# Patient Record
Sex: Female | Born: 1990 | Race: Black or African American | Hispanic: No | Marital: Single | Smoking: Never smoker
Health system: Southern US, Community
[De-identification: ages and names within clinical notes are randomized; demographics above are authoritative.]

## PROBLEM LIST (undated history)

## (undated) ENCOUNTER — Emergency Department (HOSPITAL_BASED_OUTPATIENT_CLINIC_OR_DEPARTMENT_OTHER): Admission: EM | Payer: Medicaid Other | Source: Home / Self Care

## (undated) ENCOUNTER — Inpatient Hospital Stay (HOSPITAL_COMMUNITY): Payer: Self-pay

## (undated) DIAGNOSIS — R7303 Prediabetes: Secondary | ICD-10-CM

## (undated) DIAGNOSIS — Z789 Other specified health status: Secondary | ICD-10-CM

## (undated) DIAGNOSIS — E559 Vitamin D deficiency, unspecified: Secondary | ICD-10-CM

## (undated) DIAGNOSIS — D649 Anemia, unspecified: Secondary | ICD-10-CM

## (undated) HISTORY — DX: Other specified health status: Z78.9

## (undated) HISTORY — PX: NO PAST SURGERIES: SHX2092

---

## 2015-05-02 ENCOUNTER — Emergency Department (HOSPITAL_COMMUNITY): Admission: EM | Admit: 2015-05-02 | Discharge: 2015-05-03 | Discharge status: Against Medical Advice | Payer: Self-pay

## 2015-05-02 NOTE — ED Notes (Signed)
Called pt 3x for triage. No response.  

## 2015-06-03 ENCOUNTER — Encounter (HOSPITAL_COMMUNITY): Payer: Self-pay

## 2015-06-03 ENCOUNTER — Emergency Department (HOSPITAL_COMMUNITY)
Admission: EM | Admit: 2015-06-03 | Discharge: 2015-06-03 | Disposition: A | Discharge status: Home / Self Care | Payer: Self-pay | Attending: Emergency Medicine | Admitting: Emergency Medicine

## 2015-06-03 DIAGNOSIS — R197 Diarrhea, unspecified: Secondary | ICD-10-CM | POA: Insufficient documentation

## 2015-06-03 DIAGNOSIS — R05 Cough: Secondary | ICD-10-CM | POA: Insufficient documentation

## 2015-06-03 DIAGNOSIS — K0889 Other specified disorders of teeth and supporting structures: Secondary | ICD-10-CM | POA: Insufficient documentation

## 2015-06-03 DIAGNOSIS — R51 Headache: Secondary | ICD-10-CM | POA: Insufficient documentation

## 2015-06-03 DIAGNOSIS — K08409 Partial loss of teeth, unspecified cause, unspecified class: Secondary | ICD-10-CM | POA: Insufficient documentation

## 2015-06-03 DIAGNOSIS — E669 Obesity, unspecified: Secondary | ICD-10-CM | POA: Insufficient documentation

## 2015-06-03 DIAGNOSIS — K029 Dental caries, unspecified: Secondary | ICD-10-CM | POA: Insufficient documentation

## 2015-06-03 MED ORDER — PENICILLIN V POTASSIUM 500 MG PO TABS: 500.0000 mg | ORAL_TABLET | Freq: Four times a day (QID) | ORAL | Status: AC

## 2015-06-03 MED ORDER — IBUPROFEN 400 MG PO TABS
600.0000 mg | ORAL_TABLET | Freq: Once | ORAL | Status: AC
  Administered 2015-06-03: 600 mg via ORAL
  Filled 2015-06-03: qty 1

## 2015-06-03 NOTE — ED Notes (Signed)
Patient here with left lower dental pain x 12 week, now having headache that she thinks is related to same, no distress

## 2015-06-03 NOTE — ED Provider Notes (Signed)
History  By signing my name below, I, Karle PlumberJennifer Tensley, attest that this documentation has been prepared under the direction and in the presence of LandAmerica FinancialBen Darrious Youman, PA-C. Electronically Signed: Karle PlumberJennifer Tensley, ED Scribe. 06/03/2015. 9:49 AM.  Chief Complaint  Patient presents with  . Dental Pain  . Headache   The history is provided by the patient and medical records. No language interpreter was used.    HPI Comments:  Samantha Patrick is a 25 y.o. obese female who presents to the Emergency Department complaining of severe left lower dental pain that has been ongoing for the past 4 months. She reports subjective fever, HA, cough and intermittent diarrhea all for the past 4 months. She reports taking Tylenol and Motrin for the pain with minimal relief of the pain. Eating increases the dental pain. She denies alleviating factors. She denies nausea, vomiting, facial swelling, difficulty swallowing, difficulty breathing. She does not have a dentist or a PCP.  History reviewed. No pertinent past medical history. History reviewed. No pertinent past surgical history. No family history on file. Social History  Substance Use Topics  . Smoking status: Never Smoker   . Smokeless tobacco: None  . Alcohol Use: None   OB History    No data available     Review of Systems A 10 point review of systems was completed and was negative except for pertinent positives and negatives as mentioned in the history of present illness   Allergies  Review of patient's allergies indicates no known allergies.  Home Medications   Prior to Admission medications   Medication Sig Start Date End Date Taking? Authorizing Provider  penicillin v potassium (VEETID) 500 MG tablet Take 1 tablet (500 mg total) by mouth 4 (four) times daily. 06/03/15 06/10/15  Joycie PeekBenjamin Kyndall Amero, PA-C   Triage Vitals: BP 118/86 mmHg  Pulse 76  Temp(Src) 97.4 F (36.3 C) (Oral)  Resp 20  Ht 5\' 4"  (1.626 m)  Wt 198 lb 12.8 oz (90.175 kg)   BMI 34.11 kg/m2  SpO2 100% Physical Exam  Constitutional: She appears well-developed and well-nourished. No distress.  HENT:  Head: Normocephalic.  Discomfort located to left mandibular premolars. Overall poor dentition. Multiple missing teeth with active caries. Mucous membranes are moist. No unilateral tonsillar swelling, uvula midline, no glossal swelling or elevation. No trismus. No fluctuance or evidence of a drainable abscess. No other evidence of emergent infection, Retropharyngeal or Peritonsillar abscess, Ludwig or Vincents angina. Tolerating secretions well. Patent airway   Eyes: Conjunctivae and EOM are normal.  Neck: Normal range of motion. Neck supple.  Pulmonary/Chest: Effort normal. No respiratory distress.  Abdominal: Soft. She exhibits no distension.  Musculoskeletal: Normal range of motion.  Neurological: She is alert.  Skin: She is not diaphoretic.  Psychiatric: She has a normal mood and affect.    ED Course  Procedures (including critical care time) DIAGNOSTIC STUDIES: Oxygen Saturation is 100% on RA, normal by my interpretation.   COORDINATION OF CARE: 9:48 AM- Offered to administer dental block but pt declined. Will prescribe Motrin, provide work note and give referral to dentist. Also informed pt of dental clinic in [place]. Pt verbalizes understanding and agrees to plan.  Medications  ibuprofen (ADVIL,MOTRIN) tablet 600 mg (600 mg Oral Given 06/03/15 1014)    Labs Review Labs Reviewed - No data to display  Imaging Review No results found. I have personally reviewed and evaluated these images and lab results as part of my medical decision-making.   EKG Interpretation None  Filed Vitals:   06/03/15 0827  BP: 118/86  Pulse: 76  Temp: 97.4 F (36.3 C)  TempSrc: Oral  Resp: 20  Height:  (1.626 m)  Weight: 90.175 kg  SpO2: 100%   Meds given in ED:  Medications  ibuprofen (ADVIL,MOTRIN) tablet 600 mg (600 mg Oral Given 06/03/15 1014)     Discharge Medication List as of 06/03/2015 10:03 AM    START taking these medications   Details  penicillin v potassium (VEETID) 500 MG tablet Take 1 tablet (500 mg total) by mouth 4 (four) times daily., Starting 06/03/2015, Until Tue 06/10/15, Print        MDM  Here for evaluation of dental pain. On exam, there is no evidence of a drainable abscess. No trismus, glossal elevation, unilateral tonsillar swelling. No evidence of retropharyngeal or peritonsillar abscess or Ludwig angina. Patient Refuses oral nerve block, requests Motrin. Discharged with outpatient dental resources. Also given prescription for anti-inflammatories and antibiotics. Overall, appears well, nontoxic and appropriate for discharge.  Final diagnoses:  Pain, dental    I personally performed the services described in this documentation, which was scribed in my presence. The recorded information has been reviewed and is accurate.     Joycie Peek, PA-C 06/03/15 1046  Alvira Monday, MD 06/04/15 1144

## 2015-06-03 NOTE — Discharge Instructions (Signed)
Please take your antibiotics as prescribed. Follow-up with your dentist or use to test resources to find a dentist for definitive care. Continue using Tylenol and Motrin for your discomfort. Return to ED for any new or worsening symptoms as discussed.  [facility]  [employer] [facility] [employer]  [facility] [facility]  [address]  [place], [place] [address]  Phone [phone]  [facility] [employer] [facility] [place] exemplifies the [employer] vision to improve the health and quality of life of all [place] by Public house managercreating leaders with a passion to care for the underserved and by leading the nation in community-based, service learning oral health education. We are committed to offering comprehensive general dental services for adults, children and special needs patients in a safe, caring and professional setting.  Appointments: Our clinic is open Monday through Friday 8:00 a.m. until 5:00 p.m. The amount of time scheduled for an appointment depends on the patients specific needs. We ask that you keep your appointed time for care or provide 24-hour notice of all appointment changes. Parents or legal guardians must accompany minor children.  Payment for Services: Medicaid and other insurance plans are welcome. Payment for services is due when services are rendered and may be made by cash or credit card. If you have dental insurance, we will assist you with your claim submission.   Emergencies: Emergency services will be provided Monday through Friday on a walk-in basis. Please arrive early for emergency services. After hours emergency services will be provided for patients of record as required.  Services:  Medical illustratorComprehensive General Dentistry  Childrens Dentistry  Oral Surgery - Extractions  Root Canals  Sealants and Tooth Colored Fillings  Crowns and Bridges   Dentures and Partial Dentures  Implant Services  Periodontal Services and Cleanings  Cosmetic Building services engineerTooth Whitening  Digital Radiography  3-D/[facility] Imaging

## 2015-10-27 ENCOUNTER — Emergency Department (HOSPITAL_COMMUNITY)
Admission: EM | Admit: 2015-10-27 | Discharge: 2015-10-27 | Disposition: A | Discharge status: Home / Self Care | Payer: Self-pay | Attending: Emergency Medicine | Admitting: Emergency Medicine

## 2015-10-27 ENCOUNTER — Emergency Department (HOSPITAL_COMMUNITY): Payer: Self-pay

## 2015-10-27 ENCOUNTER — Encounter (HOSPITAL_COMMUNITY): Payer: Self-pay

## 2015-10-27 DIAGNOSIS — R0789 Other chest pain: Secondary | ICD-10-CM | POA: Insufficient documentation

## 2015-10-27 DIAGNOSIS — G43909 Migraine, unspecified, not intractable, without status migrainosus: Secondary | ICD-10-CM | POA: Insufficient documentation

## 2015-10-27 LAB — BASIC METABOLIC PANEL
ANION GAP: 8 (ref 5–15)
BUN: 8 mg/dL (ref 6–20)
CHLORIDE: 109 mmol/L (ref 101–111)
CO2: 21 mmol/L — AB (ref 22–32)
Calcium: 9.2 mg/dL (ref 8.9–10.3)
Creatinine, Ser: 0.87 mg/dL (ref 0.44–1.00)
GFR calc Af Amer: >60 mL/min (ref >60)
GFR calc non Af Amer: >60 mL/min (ref >60)
GLUCOSE: 91 mg/dL (ref 65–99)
POTASSIUM: 3.7 mmol/L (ref 3.5–5.1)
Sodium: 138 mmol/L (ref 135–145)

## 2015-10-27 LAB — CBC
HEMATOCRIT: 39.6 % (ref 36.0–46.0)
HEMOGLOBIN: 12.9 g/dL (ref 12.0–15.0)
MCH: 27.7 pg (ref 26.0–34.0)
MCHC: 32.6 g/dL (ref 30.0–36.0)
MCV: 85 fL (ref 78.0–100.0)
Platelets: 338 10*3/uL (ref 150–400)
RBC: 4.66 MIL/uL (ref 3.87–5.11)
RDW: 13.7 % (ref 11.5–15.5)
WBC: 9.2 10*3/uL (ref 4.0–10.5)

## 2015-10-27 LAB — I-STAT TROPONIN, ED
Troponin i, poc: 0 ng/mL (ref 0.00–0.08)
Troponin i, poc: 0 ng/mL (ref 0.00–0.08)

## 2015-10-27 LAB — I-STAT BETA HCG BLOOD, ED (MC, WL, AP ONLY)

## 2015-10-27 MED ORDER — IBUPROFEN 600 MG PO TABS: 600.0000 mg | ORAL_TABLET | Freq: Four times a day (QID) | ORAL | 0 refills | Status: AC | PRN

## 2015-10-27 MED ORDER — IBUPROFEN 400 MG PO TABS
600.0000 mg | ORAL_TABLET | Freq: Once | ORAL | Status: AC
  Administered 2015-10-27: 600 mg via ORAL
  Filled 2015-10-27: qty 1

## 2015-10-27 NOTE — ED Provider Notes (Signed)
[facility]-EMERGENCY DEPT Provider Note   CSN: [id number] Arrival date & time: 10/27/15  1612  First Provider Contact:  None       History   Chief Complaint Chief Complaint  Patient presents with  . Chest Pain  . Migraine    HPI Samantha Patrick is a 25 y.o. female.  HPI  Patient with no PMH presents with concern for chest pain.  It started this morning, is tight in nature, radiates to the left arm, and was 8/10 at max.  It is 6/10 right now.  It is worse with moving the upper extremities, but not with exertion.  There is no associated SOB or abdominal pain.    No past medical history on file.  There are no active problems to display for this patient.   No past surgical history on file.  OB History    No data available       Home Medications    Prior to Admission medications   Not on File    Family History No family history on file.  Social History Social History  Substance Use Topics  . Smoking status: Never Smoker  . Smokeless tobacco: Never Used  . Alcohol use Yes     Allergies   Review of patient's allergies indicates no known allergies.   Review of Systems Review of Systems  Constitutional: Negative for chills and fever.  HENT: Negative for ear pain and sore throat.   Eyes: Negative for pain and visual disturbance.  Respiratory: Negative for cough and shortness of breath.   Cardiovascular: Positive for chest pain. Negative for palpitations.  Gastrointestinal: Negative for abdominal pain and vomiting.  Genitourinary: Negative for dysuria and hematuria.  Musculoskeletal: Negative for arthralgias and back pain.  Skin: Negative for color change and rash.  Neurological: Negative for seizures and syncope.  All other systems reviewed and are negative.    Physical Exam Updated Vital Signs BP 133/82   Pulse 84   Temp 98.2 F (36.8 C) (Oral)   Resp 16   Ht 5\' 3"  (1.6 m)   LMP 10/09/2015 (Exact Date)   SpO2 97%   Physical Exam  Constitutional:  She appears well-developed and well-nourished. No distress.  HENT:  Head: Normocephalic and atraumatic.  Eyes: Conjunctivae are normal.  Neck: Neck supple.  Cardiovascular: Normal rate and regular rhythm.   No murmur heard. Pulmonary/Chest: Effort normal and breath sounds normal. No respiratory distress. She exhibits tenderness.  Abdominal: Soft. There is no tenderness.  Musculoskeletal: She exhibits no edema.  Neurological: She is alert.  Skin: Skin is warm and dry.  Psychiatric: She has a normal mood and affect.  Nursing note and vitals reviewed.    ED Treatments / Results  Labs (all labs ordered are listed, but only abnormal results are displayed) Labs Reviewed  BASIC METABOLIC PANEL - Abnormal; Notable for the following:       Result Value   CO2 21 (*)    All other components within normal limits  CBC  I-STAT TROPOININ, ED    EKG  EKG Interpretation None       Radiology Dg Chest 2 View  Result Date: 10/27/2015 CLINICAL DATA:  Chest pain. EXAM: CHEST  2 VIEW COMPARISON:  None. FINDINGS: The heart size and mediastinal contours are within normal limits. Both lungs are clear. No pneumothorax or pleural effusion is noted. The visualized skeletal structures are unremarkable. IMPRESSION: No active cardiopulmonary disease. Electronically Signed   By: Lupita RaiderJames  Green Jr, M.D.  On: 10/27/2015 17:02    Procedures Procedures (including critical care time)  Medications Ordered in ED Medications - No data to display   Initial Impression / Assessment and Plan / ED Course  I have reviewed the triage vital signs and the nursing notes.  Pertinent labs & imaging results that were available during my care of the patient were reviewed by me and considered in my medical decision making (see chart for details).  Clinical Course     Patient presents for evaluation of chest pain.  HEART Pathway evaluation is low risk with a score of 2.  Troponin negative x2.  EKG is NSR with  non-specific T-wave changes.  Feel like PNA unlikely given CXR without infiltrates or effusion, no leukocytosis, no fever.  No mediastinal widening on CXR.  Doubt emergent intraabdominal pathology given benign abdominal exam and no abdominal symptoms.  Low risk Wells and PERC.  Impression is mmsk chest pain.    Final Clinical Impressions(s) / ED Diagnoses   Final diagnoses:  None    New Prescriptions New Prescriptions   No medications on file     Marcelina Morel, MD 10/27/15 2229    Laurence Spates, MD 10/31/15 1724

## 2015-10-27 NOTE — ED Triage Notes (Signed)
Pt complaining of chest pain and migraine. Pt complaining of L chest tightness beginning today.

## 2015-12-20 ENCOUNTER — Emergency Department (HOSPITAL_COMMUNITY): Payer: Self-pay

## 2015-12-20 ENCOUNTER — Encounter (HOSPITAL_COMMUNITY): Payer: Self-pay | Admitting: Oncology

## 2015-12-20 ENCOUNTER — Emergency Department (HOSPITAL_COMMUNITY)
Admission: EM | Admit: 2015-12-20 | Discharge: 2015-12-20 | Disposition: A | Discharge status: Home / Self Care | Payer: Self-pay | Attending: Emergency Medicine | Admitting: Emergency Medicine

## 2015-12-20 DIAGNOSIS — S060X0A Concussion without loss of consciousness, initial encounter: Secondary | ICD-10-CM | POA: Insufficient documentation

## 2015-12-20 DIAGNOSIS — Y939 Activity, unspecified: Secondary | ICD-10-CM | POA: Insufficient documentation

## 2015-12-20 DIAGNOSIS — Y929 Unspecified place or not applicable: Secondary | ICD-10-CM | POA: Insufficient documentation

## 2015-12-20 DIAGNOSIS — Y999 Unspecified external cause status: Secondary | ICD-10-CM | POA: Insufficient documentation

## 2015-12-20 DIAGNOSIS — W228XXA Striking against or struck by other objects, initial encounter: Secondary | ICD-10-CM | POA: Insufficient documentation

## 2015-12-20 MED ORDER — PROMETHAZINE HCL 25 MG/ML IJ SOLN
25.0000 mg | Freq: Once | INTRAMUSCULAR | Status: DC
  Filled 2015-12-20: qty 1

## 2015-12-20 MED ORDER — KETOROLAC TROMETHAMINE 60 MG/2ML IM SOLN
30.0000 mg | Freq: Once | INTRAMUSCULAR | Status: DC
  Filled 2015-12-20: qty 2

## 2015-12-20 NOTE — ED Triage Notes (Signed)
Pt hit her head at work last week denies LOC.  Pt evaluated w/ no acute findings.  Pt states she has had a persistent HA on right side since time of accident.

## 2015-12-20 NOTE — ED Provider Notes (Signed)
[facility]-EMERGENCY DEPT Provider Note   CSN: [id number] Arrival date & time: 12/20/15  0040   By signing my name below, I, Samantha Patrick, attest that this documentation has been prepared under the direction and in the presence of Benjiman CoreNathan Marvell Tamer, MD.  Electronically Signed: Suzan SlickAshley N. Elon Patrick, ED Scribe. 12/20/15. 4:30 AM.   History   Chief Complaint Chief Complaint  Patient presents with  . Headache   The history is provided by the patient. No language interpreter was used.   HPI Comments: Samantha Patrick is a 25 y.o. female without any pertinent past medical history who presents to the Emergency Department complaining of a waxing and waning, unchanged R side HA s/p head injury sustained 1 week ago. Pt described discomfort as burning. The pt reports associated onset visual disturbance at time of head injury.  However, currently she states visual field is back to baseline. She denies syncope, numbness, or weakness.   PCP: No PCP Per Patient     History reviewed. No pertinent past medical history.  There are no active problems to display for this patient.   History reviewed. No pertinent surgical history.  OB History    No data available       Home Medications    Prior to Admission medications   Medication Sig Start Date End Date Taking? Authorizing Provider  diphenhydrAMINE (BENADRYL) 25 MG tablet Take 25 mg by mouth daily as needed for allergies.   Yes Historical Provider, MD  ibuprofen (ADVIL,MOTRIN) 200 MG tablet Take 200 mg by mouth every 6 (six) hours as needed for moderate pain.   Yes Historical Provider, MD    Family History No family history on file.  Social History Social History  Substance Use Topics  . Smoking status: Never Smoker  . Smokeless tobacco: Never Used  . Alcohol use Yes     Allergies   Review of patient's allergies indicates no known allergies.   Review of Systems Review of Systems  Constitutional: Negative for chills and fever.    Gastrointestinal: Negative for nausea and vomiting.  Neurological: Positive for headaches. Negative for syncope, weakness and numbness.  All other systems reviewed and are negative.    Physical Exam Updated Vital Signs BP 121/90 (BP Location: Left Arm)   Pulse 75   Temp 98 F (36.7 C) (Oral)   Resp 16   Ht 5\' 3"  (1.6 m)   Wt 208 lb (94.3 kg)   LMP 10/28/2015 (Approximate)   SpO2 100%   BMI 36.85 kg/m   Physical Exam  Constitutional: She is oriented to person, place, and time. She appears well-developed and well-nourished. No distress.  HENT:  Head: Normocephalic and atraumatic.  Tenderness to right temporal area of skull.  Eyes: EOM are normal. Pupils are equal, round, and reactive to light.  Neck: Normal range of motion.  Cardiovascular: Normal rate, regular rhythm and normal heart sounds.   Pulmonary/Chest: Effort normal and breath sounds normal.  Abdominal: Soft. She exhibits no distension. There is no tenderness.  Musculoskeletal: Normal range of motion.  Neurological: She is alert and oriented to person, place, and time.  Skin: Skin is warm and dry. No erythema.  Psychiatric: She has a normal mood and affect. Judgment normal.  Nursing note and vitals reviewed.    ED Treatments / Results   DIAGNOSTIC STUDIES: Oxygen Saturation is 100% on RA, normal by my interpretation.    COORDINATION OF CARE:  3:50 AM- Will order imaging, blood work and IV medications. Discussed treatment  plan with pt at bedside and pt agreed to plan.    Labs (all labs ordered are listed, but only abnormal results are displayed) Labs Reviewed - No data to display  EKG  EKG Interpretation None       Radiology Ct Head Wo Contrast  Result Date: 12/20/2015 CLINICAL DATA:  Headache after hitting head 1 week ago. No loss of consciousness. EXAM: CT HEAD WITHOUT CONTRAST TECHNIQUE: Contiguous axial images were obtained from the base of the skull through the vertex without intravenous  contrast. COMPARISON:  None. FINDINGS: Brain: No evidence of acute infarction, hemorrhage, hydrocephalus, extra-axial collection or mass lesion/mass effect. Vascular: No hyperdense vessel or unexpected calcification. Skull: Normal. Negative for fracture or focal lesion. Sinuses/Orbits: Retention cysts in the right maxillary antrum. Paranasal sinuses and mastoid air cells are otherwise clear. Other: None. IMPRESSION: No acute intracranial abnormalities. Electronically Signed   By: Burman Nieves M.D.   On: 12/20/2015 04:30    Procedures Procedures (including critical care time)  Medications Ordered in ED Medications  promethazine (PHENERGAN) injection 25 mg (25 mg Intramuscular Not Given 12/20/15 0359)  ketorolac (TORADOL) injection 30 mg (30 mg Intramuscular Refused 12/20/15 0358)     Initial Impression / Assessment and Plan / ED Course  I have reviewed the triage vital signs and the nursing notes.  Pertinent labs & imaging results that were available during my care of the patient were reviewed by me and considered in my medical decision making (see chart for details).  Clinical Course    Patient with headache post getting hit in the head. Some tenderness over temporal area. Head CT reassuring. Patient refused Phenergan and Toradol for the headache. Likely concussion. Will discharge home.  Final Clinical Impressions(s) / ED Diagnoses   Final diagnoses:  Concussion, without loss of consciousness, initial encounter    New Prescriptions New Prescriptions   No medications on file  I personally performed the services described in this documentation, which was scribed in my presence. The recorded information has been reviewed and is accurate.      Benjiman Core, MD 12/20/15 [id number]

## 2015-12-20 NOTE — ED Notes (Signed)
C/o headache to right side of head X1 week. Patient states she hit her head last week at work. Denies LOC. But c/o headache and photophobia

## 2015-12-20 NOTE — ED Notes (Signed)
Patient resting in bed, family remains at bedside. No needs voiced at this time.

## 2015-12-20 NOTE — ED Notes (Signed)
Patient verbalizes understanding of discharge instructions, home care and follow up care. Patient ambulatory out of department at this time with family. 

## 2016-01-09 ENCOUNTER — Encounter (HOSPITAL_COMMUNITY): Payer: Self-pay

## 2016-01-09 ENCOUNTER — Emergency Department (HOSPITAL_COMMUNITY)
Admission: EM | Admit: 2016-01-09 | Discharge: 2016-01-09 | Disposition: A | Discharge status: Home / Self Care | Payer: Self-pay | Attending: Emergency Medicine | Admitting: Emergency Medicine

## 2016-01-09 DIAGNOSIS — Z79899 Other long term (current) drug therapy: Secondary | ICD-10-CM | POA: Insufficient documentation

## 2016-01-09 DIAGNOSIS — R112 Nausea with vomiting, unspecified: Secondary | ICD-10-CM | POA: Insufficient documentation

## 2016-01-09 LAB — CBC
HEMATOCRIT: 35.8 % — AB (ref 36.0–46.0)
HEMOGLOBIN: 11.9 g/dL — AB (ref 12.0–15.0)
MCH: 27.5 pg (ref 26.0–34.0)
MCHC: 33.2 g/dL (ref 30.0–36.0)
MCV: 82.7 fL (ref 78.0–100.0)
Platelets: 301 10*3/uL (ref 150–400)
RBC: 4.33 MIL/uL (ref 3.87–5.11)
RDW: 13.7 % (ref 11.5–15.5)
WBC: 7.9 10*3/uL (ref 4.0–10.5)

## 2016-01-09 LAB — COMPREHENSIVE METABOLIC PANEL
ALBUMIN: 3.8 g/dL (ref 3.5–5.0)
ALK PHOS: 47 U/L (ref 38–126)
ALT: 14 U/L (ref 14–54)
ANION GAP: 6 (ref 5–15)
AST: 20 U/L (ref 15–41)
BUN: 12 mg/dL (ref 6–20)
CHLORIDE: 106 mmol/L (ref 101–111)
CO2: 25 mmol/L (ref 22–32)
Calcium: 9 mg/dL (ref 8.9–10.3)
Creatinine, Ser: 0.83 mg/dL (ref 0.44–1.00)
GFR calc Af Amer: >60 mL/min (ref >60)
GFR calc non Af Amer: >60 mL/min (ref >60)
GLUCOSE: 78 mg/dL (ref 65–99)
POTASSIUM: 3.5 mmol/L (ref 3.5–5.1)
SODIUM: 137 mmol/L (ref 135–145)
Total Bilirubin: 0.7 mg/dL (ref 0.3–1.2)
Total Protein: 7.5 g/dL (ref 6.5–8.1)

## 2016-01-09 LAB — I-STAT BETA HCG BLOOD, ED (MC, WL, AP ONLY): I-stat hCG, quantitative: <5 m[IU]/mL (ref <5)

## 2016-01-09 LAB — LIPASE, BLOOD: Lipase: 31 U/L (ref 11–51)

## 2016-01-09 MED ORDER — ONDANSETRON 4 MG PO TBDP
4.0000 mg | ORAL_TABLET | Freq: Once | ORAL | Status: AC | PRN
  Administered 2016-01-09: 4 mg via ORAL
  Filled 2016-01-09: qty 1

## 2016-01-09 MED ORDER — GI COCKTAIL ~~LOC~~
30.0000 mL | Freq: Once | ORAL | Status: DC
Start: 2016-01-09 — End: 2016-01-09
  Filled 2016-01-09: qty 30

## 2016-01-09 NOTE — ED Notes (Signed)
Discharge instructions and follow up care reviewed with patient. Patient verbalized understanding. 

## 2016-01-09 NOTE — ED Provider Notes (Signed)
[facility]-EMERGENCY DEPT Provider Note   CSN: [id number] Arrival date & time: 01/09/16  1417     History   Chief Complaint Chief Complaint  Patient presents with  . Abdominal Pain    HPI Samantha Patrick is a 25 y.o. female.  The history is provided by the patient.  Emesis   This is a new problem. The current episode started 3 to 5 hours ago (1230 pm). The problem occurs 2 to 4 times per day. The problem has been resolved. The emesis has an appearance of stomach contents. There has been no fever. Associated symptoms include abdominal pain (upper after vomiting). Risk factors include suspect food intake.    History reviewed. No pertinent past medical history.  There are no active problems to display for this patient.   History reviewed. No pertinent surgical history.  OB History    No data available       Home Medications    Prior to Admission medications   Medication Sig Start Date End Date Taking? Authorizing Provider  diphenhydrAMINE (BENADRYL) 25 MG tablet Take 25 mg by mouth daily as needed for allergies.    Historical Provider, MD  ibuprofen (ADVIL,MOTRIN) 200 MG tablet Take 200 mg by mouth every 6 (six) hours as needed for moderate pain.    Historical Provider, MD    Family History No family history on file.  Social History Social History  Substance Use Topics  . Smoking status: Never Smoker  . Smokeless tobacco: Never Used  . Alcohol use Yes     Allergies   Review of patient's allergies indicates no known allergies.   Review of Systems Review of Systems  Gastrointestinal: Positive for abdominal pain (upper after vomiting) and vomiting.  All other systems reviewed and are negative.    Physical Exam Updated Vital Signs BP 121/73 (BP Location: Left Arm)   Pulse 86   Temp 98.5 F (36.9 C) (Oral)   Resp 18   Ht 5\' 4"  (1.626 m)   Wt 208 lb (94.3 kg)   LMP 12/21/2015   SpO2 100%   BMI 35.70 kg/m   Physical Exam  Constitutional: She is  oriented to person, place, and time. She appears well-developed and well-nourished. No distress.  HENT:  Head: Normocephalic.  Nose: Nose normal.  Eyes: Conjunctivae are normal.  Neck: Neck supple. No tracheal deviation present.  Cardiovascular: Normal rate, regular rhythm and normal heart sounds.   Pulmonary/Chest: Effort normal and breath sounds normal. No respiratory distress.  Abdominal: Soft. She exhibits no distension. There is no tenderness. There is no rebound and no guarding.  Neurological: She is alert and oriented to person, place, and time.  Skin: Skin is warm and dry.  Psychiatric: She has a normal mood and affect.  Vitals reviewed.    ED Treatments / Results  Labs (all labs ordered are listed, but only abnormal results are displayed) Labs Reviewed  CBC - Abnormal; Notable for the following:       Result Value   Hemoglobin 11.9 (*)    HCT 35.8 (*)    All other components within normal limits  LIPASE, BLOOD  COMPREHENSIVE METABOLIC PANEL  URINALYSIS, ROUTINE W REFLEX MICROSCOPIC (NOT AT [facility])  I-STAT BETA HCG BLOOD, ED ([facility], [facility], [facility] ONLY)    EKG  EKG Interpretation None       Radiology No results found.  Procedures Procedures (including critical care time)  Medications Ordered in ED Medications  ondansetron (ZOFRAN-ODT) disintegrating tablet 4 mg (4 mg  Oral Given 01/09/16 1443)     Initial Impression / Assessment and Plan / ED Course  I have reviewed the triage vital signs and the nursing notes.  Pertinent labs & imaging results that were available during my care of the patient were reviewed by me and considered in my medical decision making (see chart for details).  Clinical Course    25 y.o. female presents with emesis after biting into a [employer] burger and seeing what appeared to her to be a small creature which caused her to vomit 4 times and she noted her nose bleed some. She states the creature crawled across her dashboard and she  caught it in a bag. She shows the bag here which has only a half eaten burger and no insects or other abnormalities. She took pictures of what appears to be a piece of lint on the burger but she states was moving. I explained that I am unaware of any such creature which may be potentially harmful to her that looks like her picture. Her symptoms have resolved. I recommended avoiding fast food and return precautions were discussed.   Final Clinical Impressions(s) / ED Diagnoses   Final diagnoses:  Nausea and vomiting, intractability of vomiting not specified, unspecified vomiting type    New Prescriptions New Prescriptions   No medications on file     Lyndal Pulleyaniel Lindyn Vossler, MD 01/10/16 0214

## 2016-01-09 NOTE — ED Triage Notes (Signed)
Pt c/o abd pain and vomiting that started after she ate a burger at [employer] today. Pt states when she threw up she noticed a bug in the vomit. Pt does not currently have diarrhea but states she feels like she needs to go. Pt reports vomiting about five times since eating burger.

## 2016-01-09 NOTE — ED Notes (Signed)
Patient made aware urine sample is needed. Patient states she cannot void at this time. Patient encouraged to void when able. 

## 2016-01-09 NOTE — ED Notes (Signed)
MD at bedside. 

## 2016-01-09 NOTE — Progress Notes (Signed)
EDCM spoke to patient at bedside. Patient confirms she does not have a pcp or insurance living in [place].  EDCM provided patient with contact information to [facility], informed patient of services there and walk in times.  EDCM also provided patient with list of pcps who accept self pay patients, list of discount pharmacies and websites needymeds.org and GoodRX.com for medication assistance, phone number to inquire about the orange card, phone number to inquire about Medicaid, phone number to inquire about the Affordable Care Act, financial resources in the community such as local churches, [employer], urban ministries, and dental assistance for uninsured patients.  Patient thankful for resources.  No further EDCM needs at this time. 

## 2016-02-05 ENCOUNTER — Encounter (HOSPITAL_COMMUNITY): Payer: Self-pay | Admitting: Emergency Medicine

## 2016-02-05 DIAGNOSIS — O3481 Maternal care for other abnormalities of pelvic organs, first trimester: Secondary | ICD-10-CM | POA: Insufficient documentation

## 2016-02-05 DIAGNOSIS — Z3A01 Less than 8 weeks gestation of pregnancy: Secondary | ICD-10-CM | POA: Insufficient documentation

## 2016-02-05 DIAGNOSIS — O23511 Infections of cervix in pregnancy, first trimester: Secondary | ICD-10-CM | POA: Insufficient documentation

## 2016-02-05 DIAGNOSIS — N83201 Unspecified ovarian cyst, right side: Secondary | ICD-10-CM | POA: Insufficient documentation

## 2016-02-05 LAB — I-STAT CHEM 8, ED
BUN: 10 mg/dL (ref 6–20)
CREATININE: 0.8 mg/dL (ref 0.44–1.00)
Calcium, Ion: 1.19 mmol/L (ref 1.15–1.40)
Chloride: 106 mmol/L (ref 101–111)
GLUCOSE: 88 mg/dL (ref 65–99)
HEMATOCRIT: 36 % (ref 36.0–46.0)
Hemoglobin: 12.2 g/dL (ref 12.0–15.0)
Potassium: 3.8 mmol/L (ref 3.5–5.1)
Sodium: 140 mmol/L (ref 135–145)
TCO2: 21 mmol/L (ref 0–100)

## 2016-02-05 LAB — POC URINE PREG, ED: Preg Test, Ur: POSITIVE — AB

## 2016-02-05 NOTE — ED Triage Notes (Signed)
Patient states she is pregnant, LMP 12/21/15.  She states that she has had cramping on and off, her hands and feet are swollen.  She is having some brown discharge, but no frank bleeding.  Patient does have some nausea, no vomiting.

## 2016-02-06 ENCOUNTER — Emergency Department (HOSPITAL_COMMUNITY): Payer: Self-pay

## 2016-02-06 ENCOUNTER — Emergency Department (HOSPITAL_COMMUNITY)
Admission: EM | Admit: 2016-02-06 | Discharge: 2016-02-06 | Disposition: A | Discharge status: Home / Self Care | Payer: Self-pay | Attending: Emergency Medicine | Admitting: Emergency Medicine

## 2016-02-06 DIAGNOSIS — N72 Inflammatory disease of cervix uteri: Secondary | ICD-10-CM

## 2016-02-06 DIAGNOSIS — Z3491 Encounter for supervision of normal pregnancy, unspecified, first trimester: Secondary | ICD-10-CM

## 2016-02-06 DIAGNOSIS — R109 Unspecified abdominal pain: Secondary | ICD-10-CM

## 2016-02-06 LAB — URINALYSIS, ROUTINE W REFLEX MICROSCOPIC
Bilirubin Urine: NEGATIVE
Glucose, UA: NEGATIVE mg/dL
HGB URINE DIPSTICK: NEGATIVE
Ketones, ur: NEGATIVE mg/dL
LEUKOCYTES UA: NEGATIVE
NITRITE: NEGATIVE
Protein, ur: NEGATIVE mg/dL
SPECIFIC GRAVITY, URINE: 1.031 — AB (ref 1.005–1.030)
pH: 7 (ref 5.0–8.0)

## 2016-02-06 LAB — COMPREHENSIVE METABOLIC PANEL
ALBUMIN: 3.4 g/dL — AB (ref 3.5–5.0)
ALT: 10 U/L — ABNORMAL LOW (ref 14–54)
AST: 18 U/L (ref 15–41)
Alkaline Phosphatase: 47 U/L (ref 38–126)
Anion gap: 7 (ref 5–15)
BUN: 11 mg/dL (ref 6–20)
CHLORIDE: 108 mmol/L (ref 101–111)
CO2: 21 mmol/L — ABNORMAL LOW (ref 22–32)
Calcium: 8.9 mg/dL (ref 8.9–10.3)
Creatinine, Ser: 0.8 mg/dL (ref 0.44–1.00)
GFR calc Af Amer: >60 mL/min (ref >60)
Glucose, Bld: 86 mg/dL (ref 65–99)
POTASSIUM: 3.8 mmol/L (ref 3.5–5.1)
Sodium: 136 mmol/L (ref 135–145)
Total Bilirubin: 0.3 mg/dL (ref 0.3–1.2)
Total Protein: 6.7 g/dL (ref 6.5–8.1)

## 2016-02-06 LAB — CBC WITH DIFFERENTIAL/PLATELET
BASOS ABS: 0 10*3/uL (ref 0.0–0.1)
BASOS PCT: 0 %
EOS PCT: 2 %
Eosinophils Absolute: 0.2 10*3/uL (ref 0.0–0.7)
HCT: 35.6 % — ABNORMAL LOW (ref 36.0–46.0)
Hemoglobin: 11.8 g/dL — ABNORMAL LOW (ref 12.0–15.0)
Lymphocytes Relative: 33 %
Lymphs Abs: 2.7 10*3/uL (ref 0.7–4.0)
MCH: 27.7 pg (ref 26.0–34.0)
MCHC: 33.1 g/dL (ref 30.0–36.0)
MCV: 83.6 fL (ref 78.0–100.0)
MONO ABS: 0.7 10*3/uL (ref 0.1–1.0)
Monocytes Relative: 9 %
Neutro Abs: 4.6 10*3/uL (ref 1.7–7.7)
Neutrophils Relative %: 56 %
PLATELETS: 305 10*3/uL (ref 150–400)
RBC: 4.26 MIL/uL (ref 3.87–5.11)
RDW: 14.4 % (ref 11.5–15.5)
WBC: 8.2 10*3/uL (ref 4.0–10.5)

## 2016-02-06 LAB — WET PREP, GENITAL
CLUE CELLS WET PREP: NONE SEEN
SPERM: NONE SEEN
TRICH WET PREP: NONE SEEN
YEAST WET PREP: NONE SEEN

## 2016-02-06 LAB — GC/CHLAMYDIA PROBE AMP (~~LOC~~) NOT AT ARMC
Chlamydia: NEGATIVE
Neisseria Gonorrhea: NEGATIVE

## 2016-02-06 LAB — HCG, QUANTITATIVE, PREGNANCY: HCG, BETA CHAIN, QUANT, S: 28701 m[IU]/mL — AB (ref <5)

## 2016-02-06 LAB — ABO/RH: ABO/RH(D): O POS

## 2016-02-06 MED ORDER — PRENATAL COMPLETE 14-0.4 MG PO TABS: 1.0000 | ORAL_TABLET | Freq: Every day | ORAL | 3 refills | Status: DC

## 2016-02-06 MED ORDER — AZITHROMYCIN 250 MG PO TABS
1000.0000 mg | ORAL_TABLET | Freq: Once | ORAL | Status: AC
  Administered 2016-02-06: 1000 mg via ORAL
  Filled 2016-02-06: qty 4

## 2016-02-06 MED ORDER — ACETAMINOPHEN 500 MG PO TABS
1000.0000 mg | ORAL_TABLET | Freq: Once | ORAL | Status: AC
  Administered 2016-02-06: 1000 mg via ORAL
  Filled 2016-02-06: qty 2

## 2016-02-06 MED ORDER — CEFTRIAXONE SODIUM 250 MG IJ SOLR
250.0000 mg | Freq: Once | INTRAMUSCULAR | Status: AC
  Administered 2016-02-06: 250 mg via INTRAMUSCULAR
  Filled 2016-02-06: qty 250

## 2016-02-06 MED ORDER — LIDOCAINE HCL (PF) 1 % IJ SOLN
INTRAMUSCULAR | Status: AC
  Filled 2016-02-06: qty 5

## 2016-02-06 NOTE — ED Notes (Signed)
Patient left at this time with all belongings. 

## 2016-02-06 NOTE — Discharge Instructions (Signed)
°  [facility] °[email or web address] °[address] °[place], [place] °[phone]  ° ° °[facility] °[email or web address] °[address] °[place], [place] °[phone]  ° ° °[facility] °[address] °[place], [place] °[phone]  ° °[facility] °[email or web address] °[address] °[place], [place] °[phone]  ° °[facility] °[email or web address] °[address] °[place], [place] °[phone]  ° °[facility] °[email or web address] °[address] °,  °[phone] ° °

## 2016-02-06 NOTE — ED Notes (Signed)
Called lab to add on CMP.

## 2016-02-06 NOTE — ED Provider Notes (Signed)
By signing my name below, I, Samantha Patrick, attest that this documentation has been prepared under the direction and in the presence of Kiley Torrence N Maylee Bare, DO . Electronically Signed: Nelwyn SalisburyJoshua Patrick, Scribe. 02/06/2016. 3:26 AM.  TIME SEEN: 3:26AM  CHIEF COMPLAINT: Abdominal Cramping  HPI:  Samantha Patrick is a 25 y.o. female who presents to the Emergency Department complaining of sudden-onset unchanged abdominal cramps beginning today. Pt states that she discovered yesterday that she was pregnant and was concerned with her cramping. She reports associated vaginal discharge, occurring one time about 8 days ago.  She describes as is a brown discharge. No vaginal bleeding. Has had nausea but no vomiting. Had diarrhea several days ago that has resolved. She denies any dysuria or hematuria. G/P/A - 1/0/0. No history of STD. Does not have an OB/GYN. Has not yet had an ultrasound of this pregnancy.  ROS: See HPI Constitutional: no fever  Eyes: no drainage  ENT: no runny nose   Cardiovascular:  no chest pain  Resp: no SOB  GI: vomiting, nausea, diarrhea. GU: no dysuria, no hematuria, vaginal discharge (1x) Integumentary: no rash  Allergy: no hives  Musculoskeletal: no leg swelling  Neurological: no slurred speech ROS otherwise negative  PAST MEDICAL HISTORY/PAST SURGICAL HISTORY:  History reviewed. No pertinent past medical history.  MEDICATIONS:  Prior to Admission medications   Medication Sig Start Date End Date Taking? Authorizing Provider  diphenhydrAMINE (BENADRYL) 25 MG tablet Take 25 mg by mouth daily as needed for allergies.    Historical Provider, MD  ibuprofen (ADVIL,MOTRIN) 200 MG tablet Take 200 mg by mouth every 6 (six) hours as needed for moderate pain.    Historical Provider, MD    ALLERGIES:  No Known Allergies  SOCIAL HISTORY:  Social History  Substance Use Topics  . Smoking status: Never Smoker  . Smokeless tobacco: Never Used  . Alcohol use Yes    FAMILY  HISTORY: No family history on file.  EXAM: BP 99/65 (BP Location: Left Arm)   Pulse 68   Temp 98.3 F (36.8 C) (Oral)   Resp 18   Ht 5\' 4"  (1.626 m)   Wt 206 lb (93.4 kg)   LMP 12/21/2015 (Exact Date)   SpO2 100%   BMI 35.36 kg/m  CONSTITUTIONAL: Alert and oriented and responds appropriately to questions. Well-appearing; well-nourished HEAD: Normocephalic EYES: Conjunctivae clear, PERRL, EOMI ENT: normal nose; no rhinorrhea; moist mucous membranes NECK: Supple, no meningismus, no nuchal rigidity, no LAD  CARD: RRR; S1 and S2 appreciated; no murmurs, no clicks, no rubs, no gallops RESP: Normal chest excursion without splinting or tachypnea; breath sounds clear and equal bilaterally; no wheezes, no rhonchi, no rales, no hypoxia or respiratory distress, speaking full sentences. ABD/GI: Normal bowel sounds; non-distended; soft, non-tender, no rebound, no guarding, no peritoneal signs, no hepatosplenomegaly GU:  Normal external genitalia. No lesions, rashes noted. Patient has no vaginal bleeding on exam. Mucuous appear clear yellow discharge coming from cervical os.  No adnexal tenderness, mass or fullness, no cervical motion tenderness. Cervix is not appear friable. Cervix is slightly friable, erythematous beefy appearing cervix circumferentially around cervical os with no mass.  Chaperone present for exam. BACK:  The back appears normal and is non-tender to palpation, there is no CVA tenderness EXT: Normal ROM in all joints; non-tender to palpation; no edema; normal capillary refill; no cyanosis, no calf tenderness or swelling    SKIN: Normal color for age and race; warm; no rash NEURO: Moves all extremities equally, sensation  to light touch intact diffusely, cranial nerves II through XII intact, normal speech PSYCH: The patient's mood and manner are appropriate. Grooming and personal hygiene are appropriate.  MEDICAL DECISION MAKING: Patient here with vaginal discharge several days ago  and abdominal cramping while pregnant. Had a positive urine pregnancy test at home yesterday. I asked Mr. period was 12/21/2015. She would be 6 weeks and 5 days by gestational age today. On pelvic exam, patient has mild discharge with no bleeding. Cervix is friable and appears abnormal. She denies a history of colposcopy, culdocentesis. I have recommended close outpatient OB/GYN follow-up for this and will treat her for possible cervicitis. No adnexal tenderness, mass or cervical motion tenderness. Doubt torsion, TOA, PID. Will obtain ultrasound to rule out ectopic given she has had some crampy abdominal pain. Abdominal exam is benign. Beta-hCG is 28,000. Urine shows no sign of infection, no protein. Blood pressure is normal.  ED PROGRESS: Ultrasound shows a single viable IUP with heart rate of 113. She also has a right ovarian cyst. Cyst is 2 x 2 centimeters. No adnexal tenderness or unilateral tenderness on exam to suggest any torsion. Will start on prenatal vitamins. She reports her cramping abdominal pain has improved with Tylenol. We'll give her outpatient GYN follow-up. Discussed return precautions. Patient and family member at bedside are comfortable with this plan.   At this time, I do not feel there is any life-threatening condition present. I have reviewed and discussed all results (EKG, imaging, lab, urine as appropriate) and exam findings with patient/family. I have reviewed nursing notes and appropriate previous records.  I feel the patient is safe to be discharged home without further emergent workup and can continue workup as an outpatient as needed. Discussed usual and customary return precautions. Patient/family verbalize understanding and are comfortable with this plan.  Outpatient follow-up has been provided. All questions have been answered.   I personally performed the services described in this documentation, which was scribed in my presence. The recorded information has been reviewed  and is accurate.    Layla MawKristen N Laurie Lovejoy, DO 02/06/16 [phone]

## 2016-03-05 LAB — OB RESULTS CONSOLE ANTIBODY SCREEN: Antibody Screen: NEGATIVE

## 2016-03-05 LAB — OB RESULTS CONSOLE ABO/RH: RH TYPE: POSITIVE

## 2016-03-05 LAB — OB RESULTS CONSOLE RPR: RPR: NONREACTIVE

## 2016-03-05 LAB — OB RESULTS CONSOLE HIV ANTIBODY (ROUTINE TESTING): HIV: NONREACTIVE

## 2016-03-05 LAB — OB RESULTS CONSOLE GC/CHLAMYDIA
CHLAMYDIA, DNA PROBE: NEGATIVE
Gonorrhea: NEGATIVE

## 2016-03-05 LAB — OB RESULTS CONSOLE RUBELLA ANTIBODY, IGM: Rubella: IMMUNE

## 2016-03-05 LAB — OB RESULTS CONSOLE HEPATITIS B SURFACE ANTIGEN: HEP B S AG: NEGATIVE

## 2016-03-22 NOTE — L&D Delivery Note (Signed)
Delivery Note Pt reached complete dilation and pushed with a lot of coaching for 1 1/2 hour.  At 9:40 PM a healthy female was delivered via Vaginal, Spontaneous Delivery (Presentation:ROA ).  APGAR: 8, 9; weight  pending.   Placenta status: delivered spontaneously.  Cord:  with the following complications: none.  Anesthesia:  epidural Episiotomy: None Lacerations:  Second degree perineal Suture Repair: 3.0 vicryl rapide Est. Blood Loss (mL):  325mL  Mom to postpartum.  Baby to Couplet care / Skin to Skin.  Samantha Patrick 10/01/2016, 10:09 PM

## 2016-06-01 ENCOUNTER — Encounter (HOSPITAL_COMMUNITY): Payer: Self-pay | Admitting: *Deleted

## 2016-06-01 ENCOUNTER — Inpatient Hospital Stay (HOSPITAL_COMMUNITY)
Admission: AD | Admit: 2016-06-01 | Discharge: 2016-06-02 | Disposition: A | Discharge status: Home / Self Care | Payer: Medicaid Other | Source: Ambulatory Visit | Attending: Obstetrics and Gynecology | Admitting: Obstetrics and Gynecology

## 2016-06-01 DIAGNOSIS — N939 Abnormal uterine and vaginal bleeding, unspecified: Secondary | ICD-10-CM

## 2016-06-01 DIAGNOSIS — Z3A23 23 weeks gestation of pregnancy: Secondary | ICD-10-CM | POA: Insufficient documentation

## 2016-06-01 DIAGNOSIS — O9989 Other specified diseases and conditions complicating pregnancy, childbirth and the puerperium: Secondary | ICD-10-CM | POA: Diagnosis not present

## 2016-06-01 DIAGNOSIS — O4692 Antepartum hemorrhage, unspecified, second trimester: Secondary | ICD-10-CM | POA: Insufficient documentation

## 2016-06-01 NOTE — MAU Note (Signed)
PT  SAYS SHE CRAMPS - STARTED   AT 9PM.      VAG BLEEDING  STARTED  AT 6 PM -  IN UNDERWEAR-    THEN  AT 8 PM - LITTLE  MORE - THEN CALLED  DR =  TOLD  TO COME  IN- DENIES  ANY PROBLEMS   WITH PLACENTA.      SAYS NO BLEEDING  NOW.      LAST SEX-   SAT

## 2016-06-02 ENCOUNTER — Encounter (HOSPITAL_COMMUNITY): Payer: Self-pay | Admitting: *Deleted

## 2016-06-02 DIAGNOSIS — N939 Abnormal uterine and vaginal bleeding, unspecified: Secondary | ICD-10-CM | POA: Diagnosis not present

## 2016-06-02 DIAGNOSIS — O9989 Other specified diseases and conditions complicating pregnancy, childbirth and the puerperium: Secondary | ICD-10-CM

## 2016-06-02 LAB — WET PREP, GENITAL
Clue Cells Wet Prep HPF POC: NONE SEEN
SPERM: NONE SEEN
Trich, Wet Prep: NONE SEEN
Yeast Wet Prep HPF POC: NONE SEEN

## 2016-06-02 LAB — URINALYSIS, ROUTINE W REFLEX MICROSCOPIC
BILIRUBIN URINE: NEGATIVE
GLUCOSE, UA: NEGATIVE mg/dL
HGB URINE DIPSTICK: NEGATIVE
Ketones, ur: 5 mg/dL — AB
NITRITE: NEGATIVE
PROTEIN: 30 mg/dL — AB
Specific Gravity, Urine: 1.028 (ref 1.005–1.030)
pH: 5 (ref 5.0–8.0)

## 2016-06-02 LAB — GC/CHLAMYDIA PROBE AMP (~~LOC~~) NOT AT ARMC
Chlamydia: NEGATIVE
NEISSERIA GONORRHEA: NEGATIVE

## 2016-06-02 NOTE — Discharge Instructions (Signed)
Constipation, Adult °Constipation is when a person: °· Poops (has a bowel movement) fewer times in a week than normal. °· Has a hard time pooping. °· Has poop that is dry, hard, or bigger than normal. ° °Follow these instructions at home: °Eating and drinking ° °· Eat foods that have a lot of fiber, such as: °? Fresh fruits and vegetables. °? Whole grains. °? Beans. °· Eat less of foods that are high in fat, low in fiber, or overly processed, such as: °? French fries. °? Hamburgers. °? Cookies. °? Candy. °? Soda. °· Drink enough fluid to keep your pee (urine) clear or pale yellow. °General instructions °· Exercise regularly or as told by your doctor. °· Go to the restroom when you feel like you need to poop. Do not hold it in. °· Take over-the-counter and prescription medicines only as told by your doctor. These include any fiber supplements. °· Do pelvic floor retraining exercises, such as: °? Doing deep breathing while relaxing your lower belly (abdomen). °? Relaxing your pelvic floor while pooping. °· Watch your condition for any changes. °· Keep all follow-up visits as told by your doctor. This is important. °Contact a doctor if: °· You have pain that gets worse. °· You have a fever. °· You have not pooped for 4 days. °· You throw up (vomit). °· You are not hungry. °· You lose weight. °· You are bleeding from the anus. °· You have thin, pencil-like poop (stool). °Get help right away if: °· You have a fever, and your symptoms suddenly get worse. °· You leak poop or have blood in your poop. °· Your belly feels hard or bigger than normal (is bloated). °· You have very bad belly pain. °· You feel dizzy or you faint. °This information is not intended to replace advice given to you by your health care provider. Make sure you discuss any questions you have with your health care provider. °Document Released: 08/25/2007 Document Revised: 09/26/2015 Document Reviewed: 08/27/2015 °Elsevier Interactive Patient Education ©  2017 Elsevier Inc. ° °

## 2016-06-02 NOTE — MAU Provider Note (Signed)
Patient Samantha Patrick is a 26 year old G2P0 at 23 weeks and 3 days here with cramping, back pain and spotting. She has been seen for spotting in the past at the MAU and at her ob-gyn office. She endorses positive fetal movements.   History     CSN: [id number]  Arrival date and time: 06/01/16 2308   None     Chief Complaint  Patient presents with  . Abdominal Cramping   Vaginal Bleeding  The patient's primary symptoms include vaginal bleeding. The patient's pertinent negatives include no genital itching, genital lesions or genital odor. Primary symptoms comment: started at 6 pm, didn't have to change her pad. This is a new problem. The current episode started today. The problem occurs 2 to 4 times per day. The problem has been resolved. She is not pregnant. Associated symptoms include abdominal pain and back pain. Vaginal discharge characteristics: dark red. The vaginal bleeding is spotting. She has not been passing clots. She has not been passing tissue. Nothing (intercourse on saturday) aggravates the symptoms. She has tried nothing for the symptoms.  Back Pain  This is a new problem. The current episode started today. The problem occurs intermittently. The problem is unchanged. The quality of the pain is described as aching (aching and sore, always there). The pain is at a severity of 6/10. Associated symptoms include abdominal pain. She has tried nothing for the symptoms.  Abdominal Pain  This is a new (cramping at 930pm) problem. The current episode started today. The onset quality is sudden. The pain is located in the LLQ. The pain is at a severity of 2/10. The quality of the pain is cramping. The abdominal pain does not radiate.   Diet today included two hot dogs, fries, taco bell, nachos and cheese. Fruit loops for breakfast and french toast.   Last bowel movement was today.  OB History    Gravida Para Term Preterm AB Living   2             SAB TAB Ectopic Multiple Live Births               History reviewed. No pertinent past medical history.  History reviewed. No pertinent surgical history.  History reviewed. No pertinent family history.  Social History  Substance Use Topics  . Smoking status: Never Smoker  . Smokeless tobacco: Never Used  . Alcohol use Yes    Allergies: No Known Allergies  Prescriptions Prior to Admission  Medication Sig Dispense Refill Last Dose  . diphenhydrAMINE (BENADRYL) 25 MG tablet Take 25 mg by mouth daily as needed for allergies.   12/19/2015 at Unknown time  . ibuprofen (ADVIL,MOTRIN) 200 MG tablet Take 200 mg by mouth every 6 (six) hours as needed for moderate pain.   12/19/2015 at Unknown time  . Prenatal Vit-Fe Fumarate-FA (PRENATAL COMPLETE) 14-0.4 MG TABS Take 1 tablet by mouth daily. 60 each 3     Review of Systems  Constitutional: Negative.   HENT: Negative.   Eyes: Negative.   Respiratory: Negative.   Cardiovascular: Negative.   Gastrointestinal: Positive for abdominal pain.  Endocrine: Negative.   Genitourinary: Positive for vaginal bleeding.  Musculoskeletal: Positive for back pain.  Neurological: Negative.   Psychiatric/Behavioral: Negative.    Physical Exam   Blood pressure 125/75, pulse 97, temperature 97.8 F (36.6 C), temperature source Oral, resp. rate 20, height 5' 3.5" (1.613 m), weight 103.9 kg (229 lb), last menstrual period 12/21/2015.  Physical Exam  Constitutional: She  is oriented to person, place, and time. She appears well-developed.  HENT:  Head: Normocephalic.  Neck: Normal range of motion.  Respiratory: Effort normal.  GI: Soft. She exhibits no distension and no mass. There is no tenderness. There is no rebound and no guarding.  Genitourinary:  Genitourinary Comments: NEFG; trace amount of dark brown blood in the vaginal vault. No lesions on cervix or vaginal walls. No CMT, no suprapubic tenderness.   Musculoskeletal: Normal range of motion.  Neurological: She is alert and oriented  to person, place, and time.  Skin: Skin is warm and dry.  Psychiatric: She has a normal mood and affect.    MAU Course  Procedures  MDM -wet prep negative -UA: trace leuks, 5 ketones, 30 of protein NST: 150 bpm, moderate variability, no decelerations, no accelerations (23 weeks) Assessment and Plan   1. Vaginal bleeding    2. Patient stable for discharge with instructions to return to the MAU if she has any increased bleeding or cramping. Recommended Colace for constipation.  3. Plan of care reviewed with Dr. Senaida Oresichardson, who agrees.   Charlesetta GaribaldiKathryn Lorraine Gabrille Kilbride CNM 06/02/2016, 12:43 AM

## 2016-07-18 ENCOUNTER — Emergency Department (HOSPITAL_COMMUNITY)
Admission: EM | Admit: 2016-07-18 | Discharge: 2016-07-18 | Disposition: A | Discharge status: Home / Self Care | Payer: Medicaid Other | Attending: Emergency Medicine | Admitting: Emergency Medicine

## 2016-07-18 ENCOUNTER — Encounter (HOSPITAL_COMMUNITY): Payer: Self-pay | Admitting: Emergency Medicine

## 2016-07-18 DIAGNOSIS — L278 Dermatitis due to other substances taken internally: Secondary | ICD-10-CM | POA: Diagnosis not present

## 2016-07-18 DIAGNOSIS — L0231 Cutaneous abscess of buttock: Secondary | ICD-10-CM | POA: Diagnosis present

## 2016-07-18 DIAGNOSIS — L729 Follicular cyst of the skin and subcutaneous tissue, unspecified: Secondary | ICD-10-CM

## 2016-07-18 LAB — CBG MONITORING, ED: GLUCOSE-CAPILLARY: 147 mg/dL — AB (ref 65–99)

## 2016-07-18 MED ORDER — LIDOCAINE-EPINEPHRINE (PF) 1 %-1:200000 IJ SOLN
20.0000 mL | Freq: Once | INTRAMUSCULAR | Status: DC
  Filled 2016-07-18: qty 20

## 2016-07-18 NOTE — ED Provider Notes (Signed)
[facility]-EMERGENCY DEPT Provider Note   CSN: [id number] Arrival date & time: 07/18/16  1501     History   Chief Complaint Chief Complaint  Patient presents with  . Abscess    head, neck stomach    HPI Samantha Patrick is a 26 y.o. female.  The history is provided by the patient.  Abscess  Location:  Pelvis and torso Torso abscess location:  Abd LLQ Pelvic abscess location:  R buttock Size:  Pt with multiple small abscesses on right buttock and on left side of abdomen Abscess quality: fluctuance, itching and painful   Abscess quality: no redness and no warmth   Red streaking: no   Duration:  2 weeks Progression:  Worsening Pain details:    Quality:  Aching   Severity:  Moderate   Duration:  2 weeks   Timing:  Constant   Progression:  Worsening Chronicity:  New Context comment:  Pt is pregnant Relieved by:  Nothing Worsened by:  Nothing Ineffective treatments:  None tried Associated symptoms: headaches   Associated symptoms: no anorexia, no fatigue, no fever, no nausea and no vomiting   Risk factors: no prior abscess     Past Medical History:  Diagnosis Date  . Pregnant     There are no active problems to display for this patient.   History reviewed. No pertinent surgical history.  OB History    Gravida Para Term Preterm AB Living   2             SAB TAB Ectopic Multiple Live Births                   Home Medications    Prior to Admission medications   Medication Sig Start Date End Date Taking? Authorizing Provider  diphenhydrAMINE (BENADRYL) 25 MG tablet Take 25 mg by mouth daily as needed for allergies.    Historical Provider, MD  Prenatal Vit-Fe Fumarate-FA (PRENATAL COMPLETE) 14-0.4 MG TABS Take 1 tablet by mouth daily. 02/06/16   Layla Maw Ward, DO    Family History No family history on file.  Social History Social History  Substance Use Topics  . Smoking status: Never Smoker  . Smokeless tobacco: Never Used  . Alcohol use Yes      Allergies   Patient has no known allergies.   Review of Systems Review of Systems  Constitutional: Negative for chills, fatigue and fever.  HENT: Negative for ear pain and sore throat.   Eyes: Negative for pain and visual disturbance.  Respiratory: Negative for cough and shortness of breath.   Cardiovascular: Negative for chest pain and palpitations.  Gastrointestinal: Negative for abdominal pain, anorexia, nausea and vomiting.  Genitourinary: Negative for dysuria and hematuria.  Musculoskeletal: Positive for myalgias. Negative for arthralgias and back pain.  Skin: Negative for color change and rash.  Neurological: Positive for headaches. Negative for seizures and syncope.  All other systems reviewed and are negative.    Physical Exam Updated Vital Signs BP 134/79 (BP Location: Left Arm)   Pulse (!) 132   Temp 98.1 F (36.7 C) (Oral)   Resp 18   Ht 5' 3.5" (1.613 m)   LMP 12/21/2015 (Exact Date)   SpO2 99%   Physical Exam  Constitutional: She appears well-developed and well-nourished. No distress.  HENT:  Head: Normocephalic and atraumatic.  Eyes: Conjunctivae and EOM are normal.  Neck: Neck supple.  Cardiovascular: Normal rate and regular rhythm.   No murmur heard. Pulmonary/Chest: Effort normal and breath  sounds normal. No respiratory distress.  Abdominal: Soft. There is no tenderness.    Musculoskeletal: She exhibits no edema.  Neurological: She is alert. No cranial nerve deficit or sensory deficit. GCS eye subscore is 4. GCS verbal subscore is 5. GCS motor subscore is 6.  Skin: Skin is warm and dry.     Psychiatric: She has a normal mood and affect.  Nursing note and vitals reviewed.    ED Treatments / Results  Labs (all labs ordered are listed, but only abnormal results are displayed) Labs Reviewed  CBG MONITORING, ED - Abnormal; Notable for the following:       Result Value   Glucose-Capillary 147 (*)    All other components within normal  limits    EKG  EKG Interpretation None       Radiology No results found.  Procedures Procedures (including critical care time)  Medications Ordered in ED Medications  lidocaine-EPINEPHrine (XYLOCAINE-EPINEPHrine) 1 %-1:200000 (PF) injection 20 mL (20 mLs Intradermal Not Given 07/18/16 1657)     Initial Impression / Assessment and Plan / ED Course  I have reviewed the triage vital signs and the nursing notes.  Pertinent labs & imaging results that were available during my care of the patient were reviewed by me and considered in my medical decision making (see chart for details).     26 year old black female who is [redacted] weeks pregnant presents in the setting of concern for multiple abscesses. Patient reports for the last 2 weeks she has noticed small painful areas which have grown on her right buttocks, left lower abdomen and on her scalp. She denies fevers, redness, purulence, history of abscesses, any significant history. Patient portion due to continued symptoms she presented today. Patient reports she discussed this with her obstetric physician who stated this could be related pregnancy.  On arrival patient with slightly elevated heart rate but otherwise hematologically stable. No tenderness to palpation of abdomen. She reports that her pregnancy has been without competitions. Examination she does have small tender fluctuant areas to right buttocks left lower abdomen and tender slightly fluctuant area on scalp. No redness, warmth, other signs of infection. Bedside ultrasound was performed which demonstrated small areas without imaging concerning for abscess. Areas more consistent with likely cyst. Discussed case with obstetric team on-call and they're in agreement with no incision and drainage and plan for close follow-up with OB team. Patient has no systemic signs of illness do not believe antibiotics are indicated. Additionally as this has been present for 2 weeks feel the abscess is  very unlikely as these have not substantially increased in size. The patient also complained of some mild left neck swelling and lymphadenopathy is present.  We'll discharge patient home at this time with plan to follow-up with OB for further management as condition. Advised to use Tylenol for further management of pain. Strict return precautions given and patient stable at time of discharge.  Final Clinical Impressions(s) / ED Diagnoses   Final diagnoses:  Cyst of buttocks  Scalp cyst    New Prescriptions New Prescriptions   No medications on file     Stacy Gardner, MD 07/18/16 1658    Rolland Porter, MD 07/18/16 1711

## 2016-07-18 NOTE — ED Triage Notes (Signed)
Pt. Stated, I have an abscess on my head, stomach and nec. For 2 weeks. Pt. Is pregnant.

## 2016-09-02 LAB — OB RESULTS CONSOLE GBS: GBS: NEGATIVE

## 2016-09-27 ENCOUNTER — Telehealth (HOSPITAL_COMMUNITY): Payer: Self-pay | Admitting: *Deleted

## 2016-09-27 NOTE — Telephone Encounter (Signed)
Preadmission screen  

## 2016-09-28 ENCOUNTER — Telehealth (HOSPITAL_COMMUNITY): Payer: Self-pay | Admitting: *Deleted

## 2016-09-28 ENCOUNTER — Encounter (HOSPITAL_COMMUNITY): Payer: Self-pay | Admitting: *Deleted

## 2016-09-28 NOTE — Telephone Encounter (Signed)
Preadmission screen  

## 2016-09-30 NOTE — H&P (Signed)
[place] Evalee MuttonWhitelow is a 26 y.o. female at 1940 5/7 weeks (EDD 09/26/16 by LMP c/w 6 week US) presenting for IOL post due date.  Prenatal care significant for a false positive RPR-negative treponemal screen and multiple skin abscesses which required antibiotic treatment.  OB History    Gravida Para Term Preterm AB Living   1             SAB TAB Ectopic Multiple Live Births                 Past Medical History:  Diagnosis Date  . Medical history non-contributory   . Pregnant    Past Surgical History:  Procedure Laterality Date  . NO PAST SURGERIES     Family History: family history includes Diabetes in her mother; Heart disease in her maternal grandmother. Social History:  reports that she has never smoked. She has never used smokeless tobacco. She reports that she drinks alcohol. She reports that she does not use drugs.     Maternal Diabetes: No Genetic Screening: Normal Maternal Ultrasounds/Referrals: Normal Fetal Ultrasounds or other Referrals:  None Maternal Substance Abuse:  No Significant Maternal Medications:  None Significant Maternal Lab Results:  None Other Comments:  None  Review of Systems  Gastrointestinal: Negative for abdominal pain.  Neurological: Negative for headaches.   Maternal Medical History:  Contractions: Frequency: irregular.   Perceived severity is mild.    Fetal activity: Perceived fetal activity is normal.   Last perceived fetal movement was within the past hour.    Prenatal Complications - Diabetes: none.      Last menstrual period 12/21/2015. Maternal Exam:  Uterine Assessment: Contraction strength is mild.  Contraction frequency is irregular.   Abdomen: Patient reports no abdominal tenderness. Estimated fetal weight is 7 1/2 lb.   Fetal presentation: vertex  Introitus: Normal vulva. Normal vagina.  Pelvis: adequate for delivery.      Physical Exam  Constitutional: She appears well-developed and well-nourished.  Cardiovascular: Normal  rate and regular rhythm.   Respiratory: Effort normal.  GI: Soft.  Genitourinary: Vagina normal.  Neurological: She is alert.  Psychiatric: She has a normal mood and affect.    Prenatal labs: ABO, Rh: O/Positive/-- (12/15 0000) Antibody: Negative (12/15 0000) Rubella: Immune (12/15 0000) RPR: Reactive but treponemal screen negative so false + HBsAg: Negative (12/15 0000)  HIV: Non-reactive (12/15 0000)  GBS: Negative (06/14 0000)  First trimester screen negative One hour GCT 101  Assessment/Plan: Pt counseled on risks and benefits of IOL and desires to proceed.  Will ripen with cytotec, then AROM/ pitocin.  Oliver PilaKathy W Kensley Lares 09/30/2016, 12:48 PM

## 2016-10-01 ENCOUNTER — Encounter (HOSPITAL_COMMUNITY): Payer: Self-pay

## 2016-10-01 ENCOUNTER — Inpatient Hospital Stay (HOSPITAL_COMMUNITY)
Admission: RE | Admit: 2016-10-01 | Discharge: 2016-10-03 | DRG: 775 | Disposition: A | Discharge status: Home / Self Care | Payer: Medicaid Other | Source: Ambulatory Visit | Attending: Obstetrics and Gynecology | Admitting: Obstetrics and Gynecology

## 2016-10-01 ENCOUNTER — Inpatient Hospital Stay (HOSPITAL_COMMUNITY): Payer: Medicaid Other | Admitting: Anesthesiology

## 2016-10-01 DIAGNOSIS — O99214 Obesity complicating childbirth: Secondary | ICD-10-CM | POA: Diagnosis present

## 2016-10-01 DIAGNOSIS — O480 Post-term pregnancy: Principal | ICD-10-CM | POA: Diagnosis present

## 2016-10-01 DIAGNOSIS — Z3490 Encounter for supervision of normal pregnancy, unspecified, unspecified trimester: Secondary | ICD-10-CM

## 2016-10-01 DIAGNOSIS — Z3A40 40 weeks gestation of pregnancy: Secondary | ICD-10-CM

## 2016-10-01 DIAGNOSIS — Z6841 Body Mass Index (BMI) 40.0 and over, adult: Secondary | ICD-10-CM

## 2016-10-01 LAB — ABO/RH: ABO/RH(D): O POS

## 2016-10-01 LAB — CBC
HEMATOCRIT: 33 % — AB (ref 36.0–46.0)
Hemoglobin: 11 g/dL — ABNORMAL LOW (ref 12.0–15.0)
MCH: 26 pg (ref 26.0–34.0)
MCHC: 33.3 g/dL (ref 30.0–36.0)
MCV: 78 fL (ref 78.0–100.0)
PLATELETS: 260 10*3/uL (ref 150–400)
RBC: 4.23 MIL/uL (ref 3.87–5.11)
RDW: 16.6 % — AB (ref 11.5–15.5)
WBC: 8.8 10*3/uL (ref 4.0–10.5)

## 2016-10-01 LAB — TYPE AND SCREEN
ABO/RH(D): O POS
Antibody Screen: NEGATIVE

## 2016-10-01 MED ORDER — OXYTOCIN BOLUS FROM INFUSION: 500.0000 mL | Freq: Once | INTRAVENOUS | Status: DC

## 2016-10-01 MED ORDER — PHENYLEPHRINE 40 MCG/ML (10ML) SYRINGE FOR IV PUSH (FOR BLOOD PRESSURE SUPPORT): 80.0000 ug | PREFILLED_SYRINGE | INTRAVENOUS | Status: DC | PRN

## 2016-10-01 MED ORDER — FENTANYL 2.5 MCG/ML BUPIVACAINE 1/10 % EPIDURAL INFUSION (WH - ANES)
INTRAMUSCULAR | Status: AC
  Filled 2016-10-01: qty 100

## 2016-10-01 MED ORDER — FENTANYL 2.5 MCG/ML BUPIVACAINE 1/10 % EPIDURAL INFUSION (WH - ANES)
14.0000 mL/h | INTRAMUSCULAR | Status: DC | PRN
  Administered 2016-10-01 (×2): 14 mL/h via EPIDURAL
  Filled 2016-10-01: qty 100

## 2016-10-01 MED ORDER — PHENYLEPHRINE 40 MCG/ML (10ML) SYRINGE FOR IV PUSH (FOR BLOOD PRESSURE SUPPORT)
PREFILLED_SYRINGE | INTRAVENOUS | Status: AC
  Filled 2016-10-01: qty 20

## 2016-10-01 MED ORDER — ONDANSETRON HCL 4 MG PO TABS: 4.0000 mg | ORAL_TABLET | ORAL | Status: DC | PRN

## 2016-10-01 MED ORDER — DIPHENHYDRAMINE HCL 50 MG/ML IJ SOLN: 12.5000 mg | INTRAMUSCULAR | Status: DC | PRN

## 2016-10-01 MED ORDER — EPHEDRINE 5 MG/ML INJ: 10.0000 mg | INTRAVENOUS | Status: DC | PRN

## 2016-10-01 MED ORDER — SIMETHICONE 80 MG PO CHEW: 80.0000 mg | CHEWABLE_TABLET | ORAL | Status: DC | PRN

## 2016-10-01 MED ORDER — LACTATED RINGERS IV SOLN
INTRAVENOUS | Status: DC
  Administered 2016-10-01: 15:00:00 via INTRAUTERINE

## 2016-10-01 MED ORDER — LIDOCAINE HCL (PF) 1 % IJ SOLN
INTRAMUSCULAR | Status: DC | PRN
  Administered 2016-10-01 (×2): 4 mL via EPIDURAL

## 2016-10-01 MED ORDER — OXYCODONE HCL 5 MG PO TABS: 5.0000 mg | ORAL_TABLET | ORAL | Status: DC | PRN

## 2016-10-01 MED ORDER — OXYTOCIN 40 UNITS IN LACTATED RINGERS INFUSION - SIMPLE MED
1.0000 m[IU]/min | INTRAVENOUS | Status: DC
  Administered 2016-10-01: 2 m[IU]/min via INTRAVENOUS
  Filled 2016-10-01: qty 1000

## 2016-10-01 MED ORDER — OXYCODONE-ACETAMINOPHEN 5-325 MG PO TABS: 2.0000 | ORAL_TABLET | ORAL | Status: DC | PRN

## 2016-10-01 MED ORDER — LACTATED RINGERS IV SOLN
500.0000 mL | Freq: Once | INTRAVENOUS | Status: AC
  Administered 2016-10-01: 500 mL via INTRAVENOUS

## 2016-10-01 MED ORDER — TERBUTALINE SULFATE 1 MG/ML IJ SOLN: 0.2500 mg | Freq: Once | INTRAMUSCULAR | Status: DC | PRN

## 2016-10-01 MED ORDER — SOD CITRATE-CITRIC ACID 500-334 MG/5ML PO SOLN
30.0000 mL | ORAL | Status: DC | PRN
  Filled 2016-10-01: qty 15

## 2016-10-01 MED ORDER — ZOLPIDEM TARTRATE 5 MG PO TABS: 5.0000 mg | ORAL_TABLET | Freq: Every evening | ORAL | Status: DC | PRN

## 2016-10-01 MED ORDER — LIDOCAINE HCL (PF) 1 % IJ SOLN
30.0000 mL | INTRAMUSCULAR | Status: DC | PRN
  Administered 2016-10-01: 30 mL via SUBCUTANEOUS
  Filled 2016-10-01: qty 30

## 2016-10-01 MED ORDER — WITCH HAZEL-GLYCERIN EX PADS: 1.0000 "application " | MEDICATED_PAD | CUTANEOUS | Status: DC | PRN

## 2016-10-01 MED ORDER — OXYTOCIN 40 UNITS IN LACTATED RINGERS INFUSION - SIMPLE MED: 2.5000 [IU]/h | INTRAVENOUS | Status: DC

## 2016-10-01 MED ORDER — OXYCODONE-ACETAMINOPHEN 5-325 MG PO TABS
1.0000 | ORAL_TABLET | ORAL | Status: DC | PRN
  Filled 2016-10-01: qty 1

## 2016-10-01 MED ORDER — LACTATED RINGERS IV SOLN: 500.0000 mL | INTRAVENOUS | Status: DC | PRN

## 2016-10-01 MED ORDER — PRENATAL MULTIVITAMIN CH
1.0000 | ORAL_TABLET | Freq: Every day | ORAL | Status: DC
  Administered 2016-10-02 – 2016-10-03 (×2): 1 via ORAL
  Filled 2016-10-01 (×2): qty 1

## 2016-10-01 MED ORDER — ONDANSETRON HCL 4 MG/2ML IJ SOLN: 4.0000 mg | Freq: Four times a day (QID) | INTRAMUSCULAR | Status: DC | PRN

## 2016-10-01 MED ORDER — MISOPROSTOL 25 MCG QUARTER TABLET
25.0000 ug | ORAL_TABLET | ORAL | Status: AC | PRN
  Administered 2016-10-01 (×2): 25 ug via VAGINAL
  Filled 2016-10-01 (×2): qty 1

## 2016-10-01 MED ORDER — TETANUS-DIPHTH-ACELL PERTUSSIS 5-2.5-18.5 LF-MCG/0.5 IM SUSP
0.5000 mL | Freq: Once | INTRAMUSCULAR | Status: DC
  Filled 2016-10-01: qty 0.5

## 2016-10-01 MED ORDER — DIPHENHYDRAMINE HCL 25 MG PO CAPS: 25.0000 mg | ORAL_CAPSULE | Freq: Four times a day (QID) | ORAL | Status: DC | PRN

## 2016-10-01 MED ORDER — BENZOCAINE-MENTHOL 20-0.5 % EX AERO
1.0000 "application " | INHALATION_SPRAY | CUTANEOUS | Status: DC | PRN
  Filled 2016-10-01: qty 56

## 2016-10-01 MED ORDER — ONDANSETRON HCL 4 MG/2ML IJ SOLN: 4.0000 mg | INTRAMUSCULAR | Status: DC | PRN

## 2016-10-01 MED ORDER — DIBUCAINE 1 % RE OINT: 1.0000 "application " | TOPICAL_OINTMENT | RECTAL | Status: DC | PRN

## 2016-10-01 MED ORDER — OXYCODONE HCL 5 MG PO TABS: 10.0000 mg | ORAL_TABLET | ORAL | Status: DC | PRN

## 2016-10-01 MED ORDER — SENNOSIDES-DOCUSATE SODIUM 8.6-50 MG PO TABS
2.0000 | ORAL_TABLET | ORAL | Status: DC
  Filled 2016-10-01: qty 2

## 2016-10-01 MED ORDER — COCONUT OIL OIL: 1.0000 "application " | TOPICAL_OIL | Status: DC | PRN

## 2016-10-01 MED ORDER — BUTORPHANOL TARTRATE 1 MG/ML IJ SOLN
1.0000 mg | INTRAMUSCULAR | Status: DC | PRN
  Administered 2016-10-01: 1 mg via INTRAVENOUS
  Filled 2016-10-01: qty 1

## 2016-10-01 MED ORDER — ACETAMINOPHEN 325 MG PO TABS: 650.0000 mg | ORAL_TABLET | ORAL | Status: DC | PRN

## 2016-10-01 MED ORDER — IBUPROFEN 600 MG PO TABS
600.0000 mg | ORAL_TABLET | Freq: Four times a day (QID) | ORAL | Status: DC
  Administered 2016-10-01 – 2016-10-03 (×7): 600 mg via ORAL
  Filled 2016-10-01 (×7): qty 1

## 2016-10-01 MED ORDER — LACTATED RINGERS IV SOLN
INTRAVENOUS | Status: DC
  Administered 2016-10-01: 01:00:00 via INTRAVENOUS

## 2016-10-01 MED ORDER — ACETAMINOPHEN 325 MG PO TABS
650.0000 mg | ORAL_TABLET | ORAL | Status: DC | PRN
  Administered 2016-10-03: 650 mg via ORAL
  Filled 2016-10-01: qty 2

## 2016-10-01 NOTE — Anesthesia Preprocedure Evaluation (Signed)
Anesthesia Evaluation  Patient identified by MRN, date of birth, ID band Patient awake    Reviewed: Allergy & Precautions, H&P , NPO status , Patient's Chart, lab work & pertinent test results  History of Anesthesia Complications Negative for: history of anesthetic complications  Airway Mallampati: II  TM Distance: >3 FB Neck ROM: full    Dental no notable dental hx. (+) Teeth Intact   Pulmonary neg pulmonary ROS,    Pulmonary exam normal breath sounds clear to auscultation       Cardiovascular negative cardio ROS Normal cardiovascular exam Rhythm:regular Rate:Normal     Neuro/Psych negative neurological ROS  negative psych ROS   GI/Hepatic negative GI ROS, Neg liver ROS,   Endo/Other  Morbid obesity  Renal/GU negative Renal ROS  negative genitourinary   Musculoskeletal   Abdominal   Peds  Hematology  (+) anemia ,   Anesthesia Other Findings Skin boils  Reproductive/Obstetrics (+) Pregnancy                             Anesthesia Physical Anesthesia Plan  ASA: II  Anesthesia Plan: Epidural   Post-op Pain Management:    Induction:   PONV Risk Score and Plan:   Airway Management Planned:   Additional Equipment:   Intra-op Plan:   Post-operative Plan:   Informed Consent: I have reviewed the patients History and Physical, chart, labs and discussed the procedure including the risks, benefits and alternatives for the proposed anesthesia with the patient or authorized representative who has indicated his/her understanding and acceptance.     Plan Discussed with:   Anesthesia Plan Comments:         Anesthesia Quick Evaluation

## 2016-10-01 NOTE — Anesthesia Procedure Notes (Signed)
Epidural Patient location during procedure: OB Start time: 10/01/2016 1:37 PM  Staffing Anesthesiologist: Karna ChristmasELLENDER, Evetta Renner P Performed: anesthesiologist   Preanesthetic Checklist Completed: patient identified, site marked, pre-op evaluation, timeout performed, IV checked, risks and benefits discussed and monitors and equipment checked  Epidural Patient position: sitting Prep: DuraPrep Patient monitoring: heart rate, cardiac monitor, continuous pulse ox and blood pressure Approach: midline Location: L3-L4 Injection technique: LOR air  Needle:  Needle type: Tuohy  Needle gauge: 17 G Needle length: 9 cm Needle insertion depth: 7 cm Catheter type: closed end flexible Catheter size: 19 Gauge Catheter at skin depth: 12 cm Test dose: negative and Other  Assessment Events: blood not aspirated, injection not painful, no injection resistance and negative IV test  Additional Notes Informed consent obtained prior to proceeding including risk of failure, 1% risk of PDPH, risk of minor discomfort and bruising.  Discussed alternatives to epidural analgesia and patient desires to proceed.  Timeout performed pre-procedure verifying patient name, procedure, and platelet count.  Patient tolerated procedure well. Reason for block:procedure for pain

## 2016-10-01 NOTE — Progress Notes (Signed)
Patient ID: Samantha Patrick, female   DOB: 11-Jan-1991, 26 y.o.   MRN: [id number] Pt feeling some pressure  afeb vss FHR with good variability and scalp stim  Cervix c/8-9/0  Good progress FHR acceptable, hope to start pushing in next hour or so.

## 2016-10-01 NOTE — Progress Notes (Signed)
Patient ID: Samantha Patrick, female   DOB: February 05, 1991, 26 y.o.   MRN: [id number] Pt apparently sat up for epidural and then changed mind because was scared, received stadol Now getting uncomfortable again  afeb VSS  80/4 per RN exam  FHR category 1  D/w pt we are having difficulty tracing her contractions and may need an IUPC. Offered pt to get epidural before placing, she is unsure so will think about it

## 2016-10-01 NOTE — Anesthesia Pain Management Evaluation Note (Signed)
  CRNA Pain Management Visit Note  Patient: Samantha Patrick, 26 y.o., female  "Hello I am a member of the anesthesia team at [facility]. We have an anesthesia team available at all times to provide care throughout the hospital, including epidural management and anesthesia for C-section. I don't know your plan for the delivery whether it a natural birth, water birth, IV sedation, nitrous supplementation, doula or epidural, but we want to meet your pain goals."   1.Was your pain managed to your expectations on prior hospitalizations?   No   2.What is your expectation for pain management during this hospitalization?     Epidural  3.How can we help you reach that goal? Open to Epidural wants to try natural  Record the patient's initial score and the patient's pain goal.   Pain: 2  Pain Goal: 9 [facility] wants you to be able to say your pain was always managed very well.  Rica RecordsICKELTON,Ermal Haberer 10/01/2016

## 2016-10-01 NOTE — Progress Notes (Signed)
Patient ID: Samantha Patrick, female   DOB: 1990/10/04, 26 y.o.   MRN: [id number] Pt received epidural and feels better but still some vaginal pain  afeb vss FHR category 2--  Good variability with variable to late mild decelerations Will start amnioinfusion and position change  Cervix 80/5/-1  IUPC placed to follow FHR and contractions closely

## 2016-10-01 NOTE — Progress Notes (Signed)
Patient ID: Samantha Patrick, female   DOB: 1990/05/03, 26 y.o.   MRN: [id number] Pt comfortable with epidural afeb VSS  FHR with good variability, some early/variable decels that resolve with position change MVU 200-120  Cervix at 1750pm 90/6-7/-1  Making progress, continue to follow

## 2016-10-01 NOTE — Progress Notes (Signed)
Patient ID: Samantha Patrick, female   DOB: 1990/07/16, 26 y.o.   MRN: [id number] Pt admitted last PM and received cytotec x 2.  Feeling mild/moderate contractions   afeb VSS FHR baseline 120-130 Overall category 1, now having some mild decelerations with contractions, hard to assess timing of them  Cervix 70/2/-2 AROM and FSE applied to follow FHR closely Fluid clear  Pt feeling some discomfort, but coping for now, plans epidural Will follow FHR closely s/p FSE placement and make sure no significant decelerations after position change off back

## 2016-10-02 ENCOUNTER — Encounter (HOSPITAL_COMMUNITY): Payer: Self-pay

## 2016-10-02 LAB — CBC
HEMATOCRIT: 30.5 % — AB (ref 36.0–46.0)
HEMOGLOBIN: 10 g/dL — AB (ref 12.0–15.0)
MCH: 25.9 pg — ABNORMAL LOW (ref 26.0–34.0)
MCHC: 32.8 g/dL (ref 30.0–36.0)
MCV: 79 fL (ref 78.0–100.0)
Platelets: 247 10*3/uL (ref 150–400)
RBC: 3.86 MIL/uL — ABNORMAL LOW (ref 3.87–5.11)
RDW: 16.6 % — AB (ref 11.5–15.5)
WBC: 13.8 10*3/uL — ABNORMAL HIGH (ref 4.0–10.5)

## 2016-10-02 NOTE — Progress Notes (Signed)
Post Partum Day 1 Subjective: no complaints, up ad lib and tolerating PO  Objective: Blood pressure 127/66, pulse 84, temperature 98.3 F (36.8 C), temperature source Oral, resp. rate 16, height 5' 3.5" (1.613 m), weight 110.2 kg (243 lb), last menstrual period 12/21/2015, SpO2 99 %, unknown if currently breastfeeding.  Physical Exam:  General: alert and cooperative Lochia: appropriate Uterine Fundus: firm    Recent Labs  10/01/16 0117 10/02/16 0528  HGB 11.0* 10.0*  HCT 33.0* 30.5*    Assessment/Plan: Plan for discharge tomorrow   LOS: 1 day   Samantha Patrick 10/02/2016, 10:27 AM

## 2016-10-02 NOTE — Anesthesia Postprocedure Evaluation (Signed)
Anesthesia Post Note  Patient: Samantha Patrick  Procedure(s) Performed: * No procedures listed *     Patient location during evaluation: Mother Baby Anesthesia Type: Epidural Level of consciousness: awake and alert, oriented and patient cooperative Pain management: pain level controlled Vital Signs Assessment: post-procedure vital signs reviewed and stable Respiratory status: spontaneous breathing Cardiovascular status: stable Postop Assessment: no headache, epidural receding, patient able to bend at knees and no signs of nausea or vomiting Anesthetic complications: no Comments: Pain Score 0.    Last Vitals:  Vitals:   10/02/16 0108 10/02/16 0515  BP:  127/66  Pulse: 81 84  Resp:  16  Temp:  36.8 C    Last Pain:  Vitals:   10/02/16 0630  TempSrc:   PainSc: Asleep   Pain Goal: Patients Stated Pain Goal: 5 (10/01/16 2351)               Merrilyn PumaWRINKLE,Daouda Lonzo

## 2016-10-02 NOTE — Lactation Note (Signed)
This note was copied from a baby's chart. Lactation Consultation Note  Patient Name: Samantha Patrick Today's Date: 10/02/2016 Reason for consult: Initial assessment Breastfeeding consultation services and support information given and reviewed.  Baby latched and nursed well on both breasts initially.  Baby is sleepy now and not showing feeding cues.  Attempted to latch baby in football hold.  Taught hand expression and colostrum flowing freely.  Baby opens wide but then only holds breast in her mouth.  Discussed normal first 24 hour feeding behavior.  Encouraged to call out for assist/concerns prn.  Maternal Data Has patient been taught Hand Expression?: Yes Does the patient have breastfeeding experience prior to this delivery?: No  Feeding Feeding Type: Breast Fed Length of feed: 0 min  LATCH Score/Interventions Latch: Too sleepy or reluctant, no latch achieved, no sucking elicited. Intervention(s): Skin to skin;Teach feeding cues;Waking techniques Intervention(s): Adjust position;Assist with latch;Breast massage;Breast compression  Audible Swallowing: None Intervention(s): Skin to skin;Hand expression Intervention(s): Skin to skin;Hand expression  Type of Nipple: Everted at rest and after stimulation  Comfort (Breast/Nipple): Soft / non-tender     Hold (Positioning): Assistance needed to correctly position infant at breast and maintain latch. Intervention(s): Breastfeeding basics reviewed;Support Pillows;Position options;Skin to skin  LATCH Score: 5  Lactation Tools Discussed/Used WIC Program: Yes   Consult Status Consult Status: Follow-up Date: 10/03/16 Follow-up type: In-patient    Huston FoleyMOULDEN, Jamarcus Laduke S 10/02/2016, 11:49 AM

## 2016-10-02 NOTE — Plan of Care (Signed)
Problem: Nutritional: Goal: Mothers verbalization of comfort with breastfeeding process will improve Outcome: Progressing Pt reports concerns with breastfeeding.  Baby sleep and not wanting to latch.  Demonstrated waking techniques.  Pt very easy to express colostrum but baby not very eager at the breast.  Pt reports she wants to breastfeed and formula feed and pump.  Encouraged Pt to call for assistance latching.  Educated Pt on the risks of formula feeding and giving a bottle.  Will inform lactation and continue to monitor

## 2016-10-03 MED ORDER — IBUPROFEN 600 MG PO TABS: 600.0000 mg | ORAL_TABLET | Freq: Four times a day (QID) | ORAL | 0 refills | Status: DC

## 2016-10-03 MED ORDER — ACETAMINOPHEN 325 MG PO TABS: 650.0000 mg | ORAL_TABLET | ORAL | 0 refills | Status: DC | PRN

## 2016-10-03 NOTE — Lactation Note (Deleted)
This note was copied from a baby's chart. Lactation Consultation Note  Patient Name: Samantha Patrick Today's Date: 10/03/2016  Mom has been giving many bottles.  She feels he latches at times with nipple shield.  DEBP set up in room but mom not pumping.  Discussed importance of pumping and hand expressing every 3 hours.  Mom and baby plan to go home today.  Instructed to call for latch assist before discharge if she desires.  Mom does have a DEBP at home.   Maternal Data    Feeding Feeding Type: Bottle Fed - Formula Length of feed: 8 min  LATCH Score/Interventions                      Lactation Tools Discussed/Used     Consult Status      Huston FoleyMOULDEN, Amia Rynders S 10/03/2016, 9:59 AM

## 2016-10-03 NOTE — Discharge Summary (Signed)
OB Discharge Summary     Patient Name: [place] Haggard DOB: [birth year] MRN: [id number]  Date of admission: 10/01/2016 Delivering MD: Huel Cote   Date of discharge: 10/03/2016  Admitting diagnosis: INDUCTION Intrauterine pregnancy: [redacted]w[redacted]d     Secondary diagnosis:  Active Problems:   Term pregnancy   NSVD (normal spontaneous vaginal delivery)  Additional problems: none     Discharge diagnosis: Term Pregnancy Delivered                                                                                                Post partum procedures: none  Augmentation: AROM, Pitocin and Cytotec  Complications: None  Hospital course:  Induction of Labor With Vaginal Delivery   26 y.o. yo G1P1001 at [redacted]w[redacted]d was admitted to the hospital 10/01/2016 for induction of labor.  Indication for induction: Postdates.  Patient had an uncomplicated labor course as follows: Membrane Rupture Time/Date: 8:55 AM ,10/01/2016   Intrapartum Procedures: Episiotomy: None [1]                                         Lacerations:  2nd degree [3];Perineal [11]  Patient had delivery of a Viable infant.  Information for the patient's newborn:  Vikki, Gains Girl [place] [[id number]]  Delivery Method: Vaginal, Spontaneous Delivery (Filed from Delivery Summary)   10/01/2016  Details of delivery can be found in separate delivery note.  Patient had a routine postpartum course. Patient is discharged home 10/03/16.  Physical exam  Vitals:   10/02/16 0515 10/02/16 1254 10/02/16 1726 10/03/16 0536  BP: 127/66 127/62 (!) 110/49 (!) 110/53  Pulse: 84 94 92 83  Resp: 16 18 19 18   Temp: 98.3 F (36.8 C) 97.9 F (36.6 C) 97.8 F (36.6 C) 97.7 F (36.5 C)  TempSrc: Oral Oral Oral Oral  SpO2:  100% 96%   Weight:      Height:       General: alert and cooperative Lochia: appropriate Uterine Fundus: firm  Labs: Lab Results  Component Value Date   WBC 13.8 (H) 10/02/2016   HGB 10.0 (L) 10/02/2016   HCT 30.5 (L)  10/02/2016   MCV 79.0 10/02/2016   PLT 247 10/02/2016   CMP Latest Ref Rng & Units 02/05/2016  Glucose 65 - 99 mg/dL 88  BUN 6 - 20 mg/dL 10  Creatinine 0.86 - 5.78 mg/dL 4.69  Sodium 629 - 528 mmol/L 140  Potassium 3.5 - 5.1 mmol/L 3.8  Chloride 101 - 111 mmol/L 106  CO2 22 - 32 mmol/L -  Calcium 8.9 - 10.3 mg/dL -  Total Protein 6.5 - 8.1 g/dL -  Total Bilirubin 0.3 - 1.2 mg/dL -  Alkaline Phos 38 - 413 U/L -  AST 15 - 41 U/L -  ALT 14 - 54 U/L -    Discharge instruction: per After Visit Summary and "Baby and Me Booklet".  After visit meds:  Allergies as of 10/03/2016   No Known Allergies     Medication List  TAKE these medications   acetaminophen 325 MG tablet Commonly known as:  TYLENOL Take 2 tablets (650 mg total) by mouth every 4 (four) hours as needed (for pain scale < 4).   ibuprofen 600 MG tablet Commonly known as:  ADVIL,MOTRIN Take 1 tablet (600 mg total) by mouth every 6 (six) hours.   PRENATAL COMPLETE 14-0.4 MG Tabs Take 1 tablet by mouth daily.       Diet: routine diet  Activity: Advance as tolerated. Pelvic rest for 6 weeks.   Outpatient follow up:6 weeks Follow up Appt:No future appointments. Follow up Visit:No Follow-up on file.  Postpartum contraception: Undecided  Newborn Data: Live born female  Birth Weight: 7 lb 14.6 oz (3589 g) APGAR: 8, 9  Baby Feeding: Bottle and Breast Disposition:home with mother   10/03/2016 Oliver PilaKathy W Lc Joynt, MD

## 2016-10-03 NOTE — Lactation Note (Signed)
This note was copied from a baby's chart. Lactation Consultation Note  Patient Name: Samantha Patrick Today's Date: 10/03/2016 Reason for consult: Follow-up assessment Baby at 36 hr of life and dyad set for d/c today. Mom reports baby is latching at every hunger cue but "most of the time she is still hungry so I give her formula". She long term feeding plan is breast and formula. Suggested that she should switch to a "regular formula" for long term feeding. She has not used the DEBP because "I ain't producing nothing". Explained the DEBP is about stimulation and encourage her to post pump after every bf. Discussed baby behavior, feeding frequency, baby belly size, voids, wt loss, breast changes, and nipple care. Discussed baby behavior, feeding frequency, pumping, supplementing volumes, baby belly size, voids, wt loss, breast changes, and nipple care. Mom made an OP apt for 10/08/16 at 1130. Parents are aware of lactation services and support group. Mom will offer the breast on demand, post pump, and supplement per volume guidelines as needed. She will call for help as needed.     Maternal Data    Feeding Feeding Type: Bottle Fed - Formula Length of feed: 8 min  LATCH Score/Interventions                      Lactation Tools Discussed/Used     Consult Status Consult Status: Complete Follow-up type: Call as needed    Rulon Eisenmengerlizabeth E Cierria Height 10/03/2016, 10:21 AM

## 2016-10-03 NOTE — Progress Notes (Signed)
Post Partum Day 2 Subjective: no complaints, up ad lib and tolerating PO  Objective: Blood pressure (!) 110/53, pulse 83, temperature 97.7 F (36.5 C), temperature source Oral, resp. rate 18, height 5' 3.5" (1.613 m), weight 110.2 kg (243 lb), last menstrual period 12/21/2015, SpO2 96 %, unknown if currently breastfeeding.  Physical Exam:  General: alert and cooperative Lochia: appropriate Uterine Fundus: firm    Recent Labs  10/01/16 0117 10/02/16 0528  HGB 11.0* 10.0*  HCT 33.0* 30.5*    Assessment/Plan: Discharge home   LOS: 2 days   Oliver PilaKathy W Dorette Hartel 10/03/2016, 9:10 AM

## 2016-10-03 NOTE — Lactation Note (Signed)
This note was copied from a baby'Patrick chart. Lactation Consultation Note  Patient Name: Samantha Patrick Today'Patrick Date: 10/03/2016 Reason for consult: Follow-up assessment Mom called out for latch assist.  Baby latched easily and fed actively for 10 minutes.  Maternal Data    Feeding Feeding Type: Breast Fed Length of feed: 10 min  LATCH Score/Interventions Latch: Grasps breast easily, tongue down, lips flanged, rhythmical sucking. Intervention(Patrick): Waking techniques Intervention(Patrick): Adjust position;Assist with latch;Breast massage;Breast compression  Audible Swallowing: A few with stimulation  Type of Nipple: Everted at rest and after stimulation  Comfort (Breast/Nipple): Soft / non-tender     Hold (Positioning): Assistance needed to correctly position infant at breast and maintain latch. Intervention(Patrick): Breastfeeding basics reviewed;Support Pillows;Position options  LATCH Score: 8  Lactation Tools Discussed/Used     Consult Status Consult Status: Complete Follow-up type: Call as needed    Huston FoleyMOULDEN, Samantha Patrick 10/03/2016, 1:26 PM

## 2016-10-04 LAB — RPR, QUANT+TP ABS (REFLEX): TREPONEMA PALLIDUM AB: NEGATIVE

## 2016-10-04 LAB — RPR: RPR: REACTIVE — AB

## 2016-10-07 NOTE — Progress Notes (Signed)
Post discharge chart review completed.  

## 2016-12-24 ENCOUNTER — Emergency Department (HOSPITAL_COMMUNITY)
Admission: EM | Admit: 2016-12-24 | Discharge: 2016-12-24 | Disposition: A | Discharge status: Home / Self Care | Payer: Medicaid Other | Attending: Emergency Medicine | Admitting: Emergency Medicine

## 2016-12-24 ENCOUNTER — Encounter (HOSPITAL_COMMUNITY): Payer: Self-pay | Admitting: *Deleted

## 2016-12-24 DIAGNOSIS — S299XXA Unspecified injury of thorax, initial encounter: Secondary | ICD-10-CM | POA: Diagnosis present

## 2016-12-24 DIAGNOSIS — W208XXA Other cause of strike by thrown, projected or falling object, initial encounter: Secondary | ICD-10-CM | POA: Insufficient documentation

## 2016-12-24 DIAGNOSIS — Y929 Unspecified place or not applicable: Secondary | ICD-10-CM | POA: Insufficient documentation

## 2016-12-24 DIAGNOSIS — Z79899 Other long term (current) drug therapy: Secondary | ICD-10-CM | POA: Insufficient documentation

## 2016-12-24 DIAGNOSIS — Y990 Civilian activity done for income or pay: Secondary | ICD-10-CM | POA: Insufficient documentation

## 2016-12-24 DIAGNOSIS — Y9389 Activity, other specified: Secondary | ICD-10-CM | POA: Diagnosis not present

## 2016-12-24 MED ORDER — CYCLOBENZAPRINE HCL 10 MG PO TABS: 10.0000 mg | ORAL_TABLET | Freq: Two times a day (BID) | ORAL | 0 refills | Status: DC | PRN

## 2016-12-24 NOTE — Discharge Instructions (Signed)
Please follow up with orthopedist for further evaluation of your back pain.

## 2016-12-24 NOTE — ED Notes (Signed)
Bed: WTR9 Expected date:  Expected time:  Means of arrival:  Comments: 

## 2016-12-24 NOTE — ED Triage Notes (Signed)
Per EMS. Pt reports she has had pain in her mid back since a box fell on it a month ago. Accompanied by R shoulder and arm pain.  Pt went to an UC for this and her leg gave out in x ray, so they sent her here for further eval.

## 2016-12-24 NOTE — ED Notes (Signed)
Pt was able to walk in hallway w/o assistance.

## 2016-12-24 NOTE — ED Provider Notes (Signed)
[facility]-EMERGENCY DEPT Provider Note   CSN: [id number] Arrival date & time: 12/24/16  1247     History   Chief Complaint Chief Complaint  Patient presents with  . Back Pain    HPI Samantha Patrick Samantha Patrick is a 26 y.o. female.  HPI   26 year old female brought here via EMS from an urgent care center for evaluation of mid back injury. Patient works for [employer], on September 3, she reported a moderate size box fell approximately 12 foot and struck the back of her neck and upper back. She did not fell down to the ground, and she denies any loss of consciousness, she was able to finish the rest of the shift. She reported having intermittent pain to her neck and upper back since. She decided to be evaluated by [facility] urgent care yesterday for her injury. She did receive x-ray of her cervical, thoracic, and right shoulder. She was told that she has a contusion and radiculopathy pain. Did not report having any broken bones. She was discharged home with ibuprofen and prednisone. She went to her work provider today for further evaluation. During her evaluation patient states her left knee gave out and her doctor sent her to the ER for further evaluation. Since been to the ER she has been able to ambulate. She denies any significant knee pain but still endorsed 7 out of 10 sharp pain to her neck, right shoulder and right upper back. She denies any associated numbness, bowel bladder incontinence or saddle anesthesia. No history of IV drug use or active cancer.  Past Medical History:  Diagnosis Date  . Medical history non-contributory   . Pregnant     Patient Active Problem List   Diagnosis Date Noted  . Term pregnancy 10/01/2016  . NSVD (normal spontaneous vaginal delivery) 10/01/2016    Past Surgical History:  Procedure Laterality Date  . NO PAST SURGERIES      OB History    Gravida Para Term Preterm AB Living   SAB TAB Ectopic Multiple Live Births         0 1       Home  Medications    Prior to Admission medications   Medication Sig Start Date End Date Taking? Authorizing Provider  ibuprofen (ADVIL,MOTRIN) 600 MG tablet Take 1 tablet (600 mg total) by mouth every 6 (six) hours. 10/03/16  Yes Huel Cote, MD  predniSONE (DELTASONE) 10 MG tablet Take 10 mg by mouth daily with breakfast.   Yes [provider]  acetaminophen (TYLENOL) 325 MG tablet Take 2 tablets (650 mg total) by mouth every 4 (four) hours as needed (for pain scale < 4). Patient not taking: Reported on 12/24/2016 10/03/16   Huel Cote, MD    Family History Family History  Problem Relation Age of Onset  . Diabetes Mother   . Heart disease Maternal Grandmother     Social History Social History  Substance Use Topics  . Smoking status: Never Smoker  . Smokeless tobacco: Never Used  . Alcohol use Yes     Allergies   Patient has no known allergies.   Review of Systems Review of Systems  All other systems reviewed and are negative.    Physical Exam Updated Vital Signs BP (!) 131/95 (BP Location: Right Arm)   Pulse 68   Temp 98.1 F (36.7 C) (Oral)   Resp 18   LMP 12/09/2016   SpO2 97%   Physical Exam  Constitutional: She appears well-developed and well-nourished. No distress.  Moderately obese female sitting in a chair in no acute discomfort.  HENT:  Head: Atraumatic.  Eyes: Conjunctivae are normal.  Neck: Normal range of motion. Neck supple.  Musculoskeletal: She exhibits tenderness (Tenderness along cervical, paracervical spinal muscle, thoracic and parous thoracic spinal muscle on palpation without any crepitus or step-off. No bruising noted. Full range of motion throughout ).  Neurological: She is alert.  Normal grip strength bilaterally with intact distal radial pulses  5/5 strength to bilateral lower extremities with intact patellar deep tendon reflex. Able to ambulate without assistance.  Skin: No rash noted.  Psychiatric: She has a normal  mood and affect.  Nursing note and vitals reviewed.    ED Treatments / Results  Labs (all labs ordered are listed, but only abnormal results are displayed) Labs Reviewed - No data to display  EKG  EKG Interpretation None       Radiology No results found.  Procedures Procedures (including critical care time)  Medications Ordered in ED Medications - No data to display   Initial Impression / Assessment and Plan / ED Course  I have reviewed the triage vital signs and the nursing notes.  Pertinent labs & imaging results that were available during my care of the patient were reviewed by me and considered in my medical decision making (see chart for details).     BP (!) 131/95 (BP Location: Right Arm)   Pulse 68   Temp 98.1 F (36.7 C) (Oral)   Resp 18   LMP 12/09/2016   SpO2 97%    Final Clinical Impressions(s) / ED Diagnoses   Final diagnoses:  Injury of upper back, initial encounter    New Prescriptions New Prescriptions   CYCLOBENZAPRINE (FLEXERIL) 10 MG TABLET    Take 1 tablet (10 mg total) by mouth 2 (two) times daily as needed for muscle spasms.   2:39 PM Patient here for evaluation of a back injury happened more than a month ago. She has had appropriate x-ray performed at an outside hospital for complaint without any acute fractures or dislocation. She is able to ambulate, no red flags. I encouraged patient to follow-up with orthopedist provider for further evaluation of her condition. Return precaution discussed. I have very low suspicion for cord compression causing her symptoms.   Fayrene Helper, PA-C 12/24/16 1440    Loren Racer, MD 12/24/16 [phone]

## 2017-10-03 ENCOUNTER — Ambulatory Visit: Payer: Self-pay

## 2017-10-03 ENCOUNTER — Other Ambulatory Visit: Payer: Self-pay | Admitting: Occupational Medicine

## 2017-10-03 DIAGNOSIS — M25561 Pain in right knee: Secondary | ICD-10-CM

## 2017-10-03 DIAGNOSIS — M25511 Pain in right shoulder: Secondary | ICD-10-CM

## 2017-10-26 ENCOUNTER — Emergency Department (HOSPITAL_COMMUNITY): Payer: Medicaid Other

## 2017-10-26 ENCOUNTER — Other Ambulatory Visit: Payer: Self-pay

## 2017-10-26 ENCOUNTER — Encounter (HOSPITAL_COMMUNITY): Payer: Self-pay | Admitting: Emergency Medicine

## 2017-10-26 ENCOUNTER — Emergency Department (HOSPITAL_COMMUNITY)
Admission: EM | Admit: 2017-10-26 | Discharge: 2017-10-26 | Disposition: A | Discharge status: Home / Self Care | Payer: Medicaid Other | Attending: Emergency Medicine | Admitting: Emergency Medicine

## 2017-10-26 DIAGNOSIS — Y999 Unspecified external cause status: Secondary | ICD-10-CM | POA: Insufficient documentation

## 2017-10-26 DIAGNOSIS — Y939 Activity, unspecified: Secondary | ICD-10-CM | POA: Insufficient documentation

## 2017-10-26 DIAGNOSIS — Y929 Unspecified place or not applicable: Secondary | ICD-10-CM | POA: Insufficient documentation

## 2017-10-26 DIAGNOSIS — R519 Headache, unspecified: Secondary | ICD-10-CM

## 2017-10-26 DIAGNOSIS — W208XXA Other cause of strike by thrown, projected or falling object, initial encounter: Secondary | ICD-10-CM | POA: Diagnosis not present

## 2017-10-26 DIAGNOSIS — S0990XA Unspecified injury of head, initial encounter: Secondary | ICD-10-CM | POA: Diagnosis present

## 2017-10-26 DIAGNOSIS — R51 Headache: Secondary | ICD-10-CM | POA: Insufficient documentation

## 2017-10-26 DIAGNOSIS — S060X0A Concussion without loss of consciousness, initial encounter: Secondary | ICD-10-CM | POA: Diagnosis not present

## 2017-10-26 MED ORDER — KETOROLAC TROMETHAMINE 30 MG/ML IJ SOLN
30.0000 mg | Freq: Once | INTRAMUSCULAR | Status: AC
  Administered 2017-10-26: 30 mg via INTRAMUSCULAR
  Filled 2017-10-26: qty 1

## 2017-10-26 MED ORDER — PROMETHAZINE HCL 25 MG/ML IJ SOLN
12.5000 mg | Freq: Once | INTRAMUSCULAR | Status: AC
Start: 2017-10-26 — End: 2017-10-26
  Administered 2017-10-26: 12.5 mg via INTRAMUSCULAR
  Filled 2017-10-26: qty 1

## 2017-10-26 NOTE — ED Provider Notes (Signed)
[facility] EMERGENCY DEPARTMENT Provider Note   CSN: [id number] Arrival date & time: 10/26/17  1246     History   Chief Complaint Chief Complaint  Patient presents with  . Headache    HPI Samantha Patrick is a 27 y.o. female.  HPI Patient presents with headache after getting hit in the head.  Today is Wednesday and patient states that on Saturday she was hit in the head by a box and fell.  She has had a headache since then.  It is dull of her head.  No relief with aspirin.  No neck pain.  No numbness weakness.  No confusion.  States she has a high threshold of pain so she must be hurting a lot. Past Medical History:  Diagnosis Date  . Medical history non-contributory     Patient Active Problem List   Diagnosis Date Noted  . Term pregnancy 10/01/2016  . NSVD (normal spontaneous vaginal delivery) 10/01/2016    Past Surgical History:  Procedure Laterality Date  . NO PAST SURGERIES       OB History    Gravida  1   Para  1   Term  1   Preterm      AB      Living  1     SAB      TAB      Ectopic      Multiple  0   Live Births  1            Home Medications    Prior to Admission medications   Medication Sig Start Date End Date Taking? Authorizing Provider  acetaminophen (TYLENOL) 325 MG tablet Take 2 tablets (650 mg total) by mouth every 4 (four) hours as needed (for pain scale < 4). 10/03/16  Yes Huel Coteichardson, Kathy, MD  cyclobenzaprine (FLEXERIL) 10 MG tablet Take 1 tablet (10 mg total) by mouth 2 (two) times daily as needed for muscle spasms. Patient not taking: Reported on 10/26/2017 12/24/16   Fayrene Helperran, Bowie, PA-C  ibuprofen (ADVIL,MOTRIN) 600 MG tablet Take 1 tablet (600 mg total) by mouth every 6 (six) hours. Patient not taking: Reported on 10/26/2017 10/03/16   Huel Coteichardson, Kathy, MD  predniSONE (DELTASONE) 10 MG tablet Take 10 mg by mouth daily with breakfast.    [provider]    Family History Family History  Problem Relation Age of  Onset  . Diabetes Mother   . Heart disease Maternal Grandmother     Social History Social History   Tobacco Use  . Smoking status: Never Smoker  . Smokeless tobacco: Never Used  Substance Use Topics  . Alcohol use: Not Currently    Frequency: Never  . Drug use: No     Allergies   Patient has no known allergies.   Review of Systems Review of Systems  Constitutional: Negative for fatigue and fever.  HENT: Negative for congestion.   Respiratory: Negative for shortness of breath.   Cardiovascular: Negative for chest pain.  Gastrointestinal: Negative for abdominal pain.  Genitourinary: Negative for flank pain.  Musculoskeletal: Negative for back pain.  Neurological: Positive for headaches.  Hematological: Negative for adenopathy.  Psychiatric/Behavioral: Negative for confusion.     Physical Exam Updated Vital Signs BP 120/82 (BP Location: Right Arm)   Pulse 70   Temp 98.1 F (36.7 C) (Oral)   Resp 17   Ht 5\' 3"  (1.6 m)   Wt 108.9 kg (240 lb)   LMP 10/26/2017   SpO2 98%  BMI 42.51 kg/m   Physical Exam  Constitutional: She is oriented to person, place, and time. She appears well-developed.  HENT:  Head: Normocephalic.  Eyes: Pupils are equal, round, and reactive to light.  Neck: Neck supple.  Cardiovascular: Normal rate.  Pulmonary/Chest: Effort normal.  Abdominal: Soft.  Musculoskeletal: Normal range of motion.  Neurological: She is alert and oriented to person, place, and time.  Skin: Skin is warm. Capillary refill takes less than 2 seconds.  Psychiatric: She has a normal mood and affect.     ED Treatments / Results  Labs (all labs ordered are listed, but only abnormal results are displayed) Labs Reviewed - No data to display  EKG None  Radiology Ct Head Wo Contrast  Result Date: 10/26/2017 CLINICAL DATA:  Posttraumatic headache after head injury 2 days ago. No loss of consciousness. EXAM: CT HEAD WITHOUT CONTRAST TECHNIQUE: Contiguous axial  images were obtained from the base of the skull through the vertex without intravenous contrast. COMPARISON:  CT scan of December 20, 2015. FINDINGS: Brain: No evidence of acute infarction, hemorrhage, hydrocephalus, extra-axial collection or mass lesion/mass effect. Vascular: No hyperdense vessel or unexpected calcification. Skull: Normal. Negative for fracture or focal lesion. Sinuses/Orbits: No acute finding. Other: None. IMPRESSION: Normal head CT. Electronically Signed   By: Lupita Raider, M.D.   On: 10/26/2017 14:05    Procedures Procedures (including critical care time)  Medications Ordered in ED Medications  ketorolac (TORADOL) 30 MG/ML injection 30 mg (30 mg Intramuscular Given 10/26/17 1335)  promethazine (PHENERGAN) injection 12.5 mg (12.5 mg Intramuscular Given 10/26/17 1335)     Initial Impression / Assessment and Plan / ED Course  I have reviewed the triage vital signs and the nursing notes.  Pertinent labs & imaging results that were available during my care of the patient were reviewed by me and considered in my medical decision making (see chart for details).     Patient with headache after being hit in the head.  CT scan reassuring.  May be a concussion.  Discharge home.  Final Clinical Impressions(s) / ED Diagnoses   Final diagnoses:  Acute nonintractable headache, unspecified headache type  Concussion without loss of consciousness, initial encounter    ED Discharge Orders    None       Benjiman Core, MD 10/26/17 1539

## 2017-10-26 NOTE — ED Triage Notes (Signed)
Pt reports headache for two days. Denies other symptoms.pt has taken asa without relief.

## 2017-10-26 NOTE — ED Notes (Signed)
Pt reported to nurse first that Saturday she was bending over and raised up hitting head on heavy box and that was when her head started hurting.

## 2017-11-10 ENCOUNTER — Emergency Department (HOSPITAL_COMMUNITY): Payer: Medicaid Other

## 2017-11-10 ENCOUNTER — Encounter (HOSPITAL_COMMUNITY): Payer: Self-pay | Admitting: Emergency Medicine

## 2017-11-10 ENCOUNTER — Emergency Department (HOSPITAL_COMMUNITY)
Admission: EM | Admit: 2017-11-10 | Discharge: 2017-11-10 | Disposition: A | Discharge status: Home / Self Care | Payer: Medicaid Other | Attending: Emergency Medicine | Admitting: Emergency Medicine

## 2017-11-10 DIAGNOSIS — Z7982 Long term (current) use of aspirin: Secondary | ICD-10-CM | POA: Diagnosis not present

## 2017-11-10 DIAGNOSIS — R600 Localized edema: Secondary | ICD-10-CM | POA: Diagnosis not present

## 2017-11-10 DIAGNOSIS — Z5329 Procedure and treatment not carried out because of patient's decision for other reasons: Secondary | ICD-10-CM

## 2017-11-10 DIAGNOSIS — Z79899 Other long term (current) drug therapy: Secondary | ICD-10-CM | POA: Diagnosis not present

## 2017-11-10 DIAGNOSIS — R079 Chest pain, unspecified: Secondary | ICD-10-CM | POA: Diagnosis not present

## 2017-11-10 DIAGNOSIS — R531 Weakness: Secondary | ICD-10-CM | POA: Diagnosis present

## 2017-11-10 DIAGNOSIS — R200 Anesthesia of skin: Secondary | ICD-10-CM

## 2017-11-10 DIAGNOSIS — Z5320 Procedure and treatment not carried out because of patient's decision for unspecified reasons: Secondary | ICD-10-CM | POA: Insufficient documentation

## 2017-11-10 LAB — CBC WITH DIFFERENTIAL/PLATELET
ABS IMMATURE GRANULOCYTES: 0 10*3/uL (ref 0.0–0.1)
BASOS ABS: 0 10*3/uL (ref 0.0–0.1)
Basophils Relative: 1 %
Eosinophils Absolute: 0.1 10*3/uL (ref 0.0–0.7)
Eosinophils Relative: 2 %
HEMATOCRIT: 40.3 % (ref 36.0–46.0)
HEMOGLOBIN: 12.6 g/dL (ref 12.0–15.0)
Immature Granulocytes: 0 %
LYMPHS PCT: 36 %
Lymphs Abs: 2.1 10*3/uL (ref 0.7–4.0)
MCH: 26.8 pg (ref 26.0–34.0)
MCHC: 31.3 g/dL (ref 30.0–36.0)
MCV: 85.6 fL (ref 78.0–100.0)
MONO ABS: 0.3 10*3/uL (ref 0.1–1.0)
Monocytes Relative: 5 %
NEUTROS ABS: 3.3 10*3/uL (ref 1.7–7.7)
Neutrophils Relative %: 56 %
Platelets: 304 10*3/uL (ref 150–400)
RBC: 4.71 MIL/uL (ref 3.87–5.11)
RDW: 13.6 % (ref 11.5–15.5)
WBC: 5.9 10*3/uL (ref 4.0–10.5)

## 2017-11-10 LAB — URINALYSIS, ROUTINE W REFLEX MICROSCOPIC
BILIRUBIN URINE: NEGATIVE
GLUCOSE, UA: NEGATIVE mg/dL
HGB URINE DIPSTICK: NEGATIVE
Ketones, ur: NEGATIVE mg/dL
NITRITE: NEGATIVE
PROTEIN: NEGATIVE mg/dL
Specific Gravity, Urine: 1.02 (ref 1.005–1.030)
pH: 6 (ref 5.0–8.0)

## 2017-11-10 LAB — I-STAT BETA HCG BLOOD, ED (MC, WL, AP ONLY): I-stat hCG, quantitative: <5 m[IU]/mL (ref <5)

## 2017-11-10 LAB — RAPID URINE DRUG SCREEN, HOSP PERFORMED
AMPHETAMINES: NOT DETECTED
Barbiturates: NOT DETECTED
Benzodiazepines: NOT DETECTED
Cocaine: NOT DETECTED
OPIATES: NOT DETECTED
TETRAHYDROCANNABINOL: NOT DETECTED

## 2017-11-10 LAB — COMPREHENSIVE METABOLIC PANEL
ALBUMIN: 3.6 g/dL (ref 3.5–5.0)
ALT: 13 U/L (ref 0–44)
AST: 19 U/L (ref 15–41)
Alkaline Phosphatase: 48 U/L (ref 38–126)
Anion gap: 8 (ref 5–15)
BILIRUBIN TOTAL: 0.6 mg/dL (ref 0.3–1.2)
BUN: 7 mg/dL (ref 6–20)
CO2: 24 mmol/L (ref 22–32)
Calcium: 8.7 mg/dL — ABNORMAL LOW (ref 8.9–10.3)
Chloride: 106 mmol/L (ref 98–111)
Creatinine, Ser: 1.01 mg/dL — ABNORMAL HIGH (ref 0.44–1.00)
GFR calc Af Amer: >60 mL/min (ref >60)
GFR calc non Af Amer: >60 mL/min (ref >60)
GLUCOSE: 101 mg/dL — AB (ref 70–99)
POTASSIUM: 3.5 mmol/L (ref 3.5–5.1)
Sodium: 138 mmol/L (ref 135–145)
TOTAL PROTEIN: 7.2 g/dL (ref 6.5–8.1)

## 2017-11-10 LAB — I-STAT TROPONIN, ED
TROPONIN I, POC: 0 ng/mL (ref 0.00–0.08)
TROPONIN I, POC: 0 ng/mL (ref 0.00–0.08)

## 2017-11-10 LAB — TSH: TSH: 2.177 u[IU]/mL (ref 0.350–4.500)

## 2017-11-10 LAB — T4, FREE: FREE T4: 0.73 ng/dL — AB (ref 0.82–1.77)

## 2017-11-10 MED ORDER — ACETAMINOPHEN 325 MG PO TABS
325.0000 mg | ORAL_TABLET | Freq: Once | ORAL | Status: AC
  Administered 2017-11-10: 325 mg via ORAL
  Filled 2017-11-10: qty 1

## 2017-11-10 MED ORDER — PROCHLORPERAZINE EDISYLATE 10 MG/2ML IJ SOLN
10.0000 mg | Freq: Once | INTRAMUSCULAR | Status: AC
  Administered 2017-11-10: 10 mg via INTRAVENOUS
  Filled 2017-11-10: qty 2

## 2017-11-10 MED ORDER — LORAZEPAM 2 MG/ML IJ SOLN
0.5000 mg | Freq: Once | INTRAMUSCULAR | Status: DC
  Filled 2017-11-10: qty 1

## 2017-11-10 MED ORDER — DIPHENHYDRAMINE HCL 50 MG/ML IJ SOLN
12.5000 mg | Freq: Once | INTRAMUSCULAR | Status: AC
  Administered 2017-11-10: 12.5 mg via INTRAVENOUS
  Filled 2017-11-10: qty 1

## 2017-11-10 MED ORDER — METOCLOPRAMIDE HCL 5 MG/ML IJ SOLN
10.0000 mg | Freq: Once | INTRAMUSCULAR | Status: AC
  Administered 2017-11-10: 10 mg via INTRAVENOUS
  Filled 2017-11-10: qty 2

## 2017-11-10 MED ORDER — LORAZEPAM 2 MG/ML IJ SOLN
INTRAMUSCULAR | Status: AC
  Administered 2017-11-10: 1 mg
  Filled 2017-11-10: qty 1

## 2017-11-10 MED ORDER — KETOROLAC TROMETHAMINE 30 MG/ML IJ SOLN
30.0000 mg | Freq: Once | INTRAMUSCULAR | Status: AC
  Administered 2017-11-10: 30 mg via INTRAVENOUS
  Filled 2017-11-10: qty 1

## 2017-11-10 MED ORDER — SODIUM CHLORIDE 0.9 % IV BOLUS
500.0000 mL | Freq: Once | INTRAVENOUS | Status: AC
  Administered 2017-11-10: 500 mL via INTRAVENOUS

## 2017-11-10 NOTE — Consult Note (Signed)
Neurology Consultation Reason for Consult: Left-sided numbness Referring Physician: Rodena MedinMessick, P  CC: Left-sided numbness  History is obtained from: Patient, mother  HPI: Samantha Patrick is a 27 y.o. female with no significant past medical history who presents with left-sided tingling that began this morning.  There was some present on awakening, but then it gradually spread from her arm up to her face.  She states that she did not have any changes in her vision, difficulty walking.  She also endorses a headache states that this has been there since awakening as well.  It is throbbing, bifrontal in location.  She does state that if light is really bright it does bother her.  She does endorse getting headaches that may coagulate on a dark room from time to time.  They can last for hours and are bifrontal in nature.  She will relate on a dark room to try and sleep when she gets one.  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  ROS: A 14 point ROS was performed and is negative except as noted in the HPI.   Past Medical History:  Diagnosis Date  . Medical history non-contributory      Family History  Problem Relation Age of Onset  . Diabetes Mother   . Heart disease Maternal Grandmother      Social History:  reports that she has never smoked. She has never used smokeless tobacco. She reports that she drank alcohol. She reports that she does not use drugs.   Exam: Current vital signs: BP 118/63   Pulse 82   Temp 98 F (36.7 C) (Oral)   Resp 17   LMP 10/26/2017   SpO2 100%  Vital signs in last 24 hours: Temp:  [98 F (36.7 C)] 98 F (36.7 C) (08/22 1124) Pulse Rate:  [69-97] 82 (08/22 2145) Resp:  [13-26] 17 (08/22 2145) BP: (98-135)/(63-88) 118/63 (08/22 2145) SpO2:  [97 %-100 %] 100 % (08/22 2145)   Physical Exam  Constitutional: Appears well-developed and well-nourished.  Psych: Affect appropriate to situation Eyes: No  scleral injection HENT: No OP obstrucion Head: Normocephalic.  Cardiovascular: Normal rate and regular rhythm.  Respiratory: Effort normal, non-labored breathing GI: Soft.  No distension. There is no tenderness.  Skin: WDI  Neuro: Mental Status: Patient is awake, alert, oriented to person, place, month, year, and situation. Patient is able to give a clear and coherent history. No signs of aphasia or neglect Cranial Nerves: II: Visual Fields are full. Pupils are equal, round, and reactive to light.   III,IV, VI: EOMI without ptosis or diploplia.  V: Facial sensation is symmetric to temperature VII: Facial movement is symmetric.  VIII: hearing is intact to voice X: Uvula elevates symmetrically XI: Shoulder shrug is symmetric. XII: tongue is midline without atrophy or fasciculations.  Motor: Tone is normal. Bulk is normal. 5/5 strength was present in all four extremities.  Sensory: Sensation is symmetric to light touch and temperature in the arms and legs. Deep Tendon Reflexes: 2+ and symmetric in the biceps and patellae.  Plantars: Toes are downgoing bilaterally.  Cerebellar: FNF and HKS are intact bilaterally   I have reviewed labs in epic and the results pertinent to this consultation are: CMP-unremarkable  I have reviewed the images obtained: MRI brain- subtle, hazy T2 hyperintensity in the left thalamus, as well as thoracic spine.  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  The presence of positive symptoms and description of the headache make me suspect that this represents comp gated migraine.  I feel that a single T2 lesion is unlikely to be that significant, could be related to migraine.  If none of her lesions enhance, but I would not think that this was an acute demyelinating process I would not favor any further acute work-up.  Recommendations: 1) migraine cocktail 2) MRI brain with contrast, MRI  thoracic spine with contrast 3) if no enhancement, she can follow-up with outpatient neurology.   Ritta Slot, MD [facility] [phone]  If 7pm- 7am, please page neurology on call as listed in [facility].

## 2017-11-10 NOTE — ED Provider Notes (Signed)
Patient sign out from B. [place], [place]. Patient here with constellation of complaints. Patient with left sided weakness onset at 0800 today. Neurology consulted, recommended MRI/A brain and MRI c-spine. Patient also with left upper arm swellling--US pending. Patient awaiting completion of testing.  10:08 PM Patient has been seen by neurology. Patient now wanting to leave before completion of MRI. Discussed with patient that her testing is incomplete, and the possibility exists of a serious underlying condition going undiagnosed. Patient insists that she does not want to stay. She is advised to follow-up with neurology.   Felicie MornSmith, Lynzie Cliburn, NP 11/11/17 0151    Wynetta FinesMessick, Peter C, MD 11/11/17 [phone]

## 2017-11-10 NOTE — ED Triage Notes (Addendum)
Went to MD for chest pain on 9th. Reports weakness in hand and left leg and arm. Swelling in left arm also starting on 9th. She states left face feels numb on and off with sharp pains in neck and back. Speech clear, no droops noted. Left grip weaker. Reports also having weakness in R leg but more prominent in left. Denies fevers. Does report headaches.

## 2017-11-10 NOTE — Discharge Instructions (Addendum)
You have chosen to leave against medical advice. You are welcome to return at any time. Please consider following up with neurology as soon as possible. Two practices are listed.

## 2017-11-10 NOTE — ED Provider Notes (Signed)
[facility] Provider Note   CSN: [id number] Arrival date & time: 11/10/17  1054     History   Chief Complaint Chief Complaint  Patient presents with  . Weakness    HPI Samantha Patrick is a 27 y.o. female presenting for multiple complaints.  Left-sided facial numbness and left-sided arm/leg weakness began abruptly at 8 AM this morning.  Patient states that the left cheek numbness was intermittent lasting a few seconds at a time for a few hours prior to arrival.  Denying left-sided facial numbness upon evaluation.  Patient states that her left arm and leg weakness have been constant since onset at 8 AM.  She states that she has not had this weakness before.  Patient endorses headache as well she states that she has had a constant frontal headache since 10/22/2017 after a box fell onto her head.  She describes the pain as throbbing and moderate in nature.  Worsened with movement of her head.  Also endorses left-sided chest pain that began at 8 AM as well.  She describes the pain as a pressure in the center of her chest, moderate in nature, she states that it does radiate to her left arm.  She states that she has not taken anything for this pain.  Patient states that she has had this pain in the past she is she is states she was seen by her primary doctor on August 9.  She states that the pain stopped after that visit and she has not had it again until this morning.  Patient states that the pain is worse with palpation to the left side of her chest.  Patient also endorses left arm swelling.  She states that she feels that her left upper arm is larger than her right upper arm.  She states that this has been present for some time, she states that this is been present since August 9 as well.   HPI  Past Medical History:  Diagnosis Date  . Medical history non-contributory     Patient Active Problem List   Diagnosis Date Noted  . Term pregnancy 10/01/2016   . NSVD (normal spontaneous vaginal delivery) 10/01/2016    Past Surgical History:  Procedure Laterality Date  . NO PAST SURGERIES       OB History    Gravida  1   Para  1   Term  1   Preterm      AB      Living  1     SAB      TAB      Ectopic      Multiple  0   Live Births  1            Home Medications    Prior to Admission medications   Medication Sig Start Date End Date Taking? Authorizing Provider  amitriptyline (ELAVIL) 50 MG tablet Take 50 mg by mouth at bedtime. 11/03/17  Yes [provider]  aspirin EC 81 MG tablet Take 81 mg by mouth every 6 (six) hours as needed for mild pain or fever.   Yes [provider]  meloxicam (MOBIC) 15 MG tablet Take 15 mg by mouth daily. 11/03/17  Yes [provider]    Family History Family History  Problem Relation Age of Onset  . Diabetes Mother   . Heart disease Maternal Grandmother     Social History Social History   Tobacco Use  . Smoking status: Never  Smoker  . Smokeless tobacco: Never Used  Substance Use Topics  . Alcohol use: Not Currently    Frequency: Never  . Drug use: No     Allergies   Patient has no known allergies.   Review of Systems Review of Systems  Constitutional: Negative.  Negative for chills and fever.  HENT: Negative.  Negative for rhinorrhea, sore throat and trouble swallowing.   Eyes: Negative.  Negative for visual disturbance.  Respiratory: Negative.  Negative for cough and shortness of breath.   Cardiovascular: Positive for chest pain.  Gastrointestinal: Negative.  Negative for abdominal pain, blood in stool, diarrhea, nausea and vomiting.  Genitourinary: Negative.  Negative for difficulty urinating, dysuria, hematuria, pelvic pain, vaginal bleeding and vaginal discharge.  Musculoskeletal: Positive for neck pain. Negative for arthralgias and myalgias.       Left arm swelling  Skin: Negative.  Negative for rash.  Neurological: Positive for  weakness, numbness and headaches. Negative for dizziness, syncope and facial asymmetry.     Physical Exam Updated Vital Signs BP 135/88   Pulse 80   Temp 98 F (36.7 C) (Oral)   Resp 17   LMP 10/26/2017   SpO2 100%   Physical Exam  Constitutional: She is oriented to person, place, and time. She appears well-developed and well-nourished. No distress.  Tired appearing  HENT:  Head: Normocephalic and atraumatic.  Right Ear: External ear normal.  Left Ear: External ear normal.  Nose: Nose normal.  Mouth/Throat: Oropharynx is clear and moist.  Eyes: Pupils are equal, round, and reactive to light. EOM are normal.  Neck: Normal range of motion. Neck supple. No tracheal deviation present.  Cardiovascular: Normal rate, regular rhythm, normal heart sounds and intact distal pulses.  Pulses:      Dorsalis pedis pulses are 2+ on the right side, and 2+ on the left side.       Posterior tibial pulses are 2+ on the right side, and 2+ on the left side.  Pulmonary/Chest: Effort normal and breath sounds normal. No respiratory distress. She has no wheezes. She exhibits tenderness. She exhibits no crepitus, no edema, no deformity and no swelling.    Abdominal: Soft. Bowel sounds are normal. There is no tenderness. There is no rigidity, no rebound, no guarding, no tenderness at McBurney's point and negative Murphy's sign.  Musculoskeletal: Normal range of motion. She exhibits edema. She exhibits no deformity.       Left upper arm: She exhibits edema.  Patient with possible swelling to the left upper arm, difficult to determine due to body habitus.  Feet:  Right Foot:  Protective Sensation: 3 sites tested. 3 sites sensed.  Left Foot:  Protective Sensation: 3 sites tested. 3 sites sensed.  Neurological: She is oriented to person, place, and time. No cranial nerve deficit or sensory deficit.  Mental Status: Alert, oriented, thought content appropriate, able to give a coherent history. Able to  follow 2 step commands without difficulty.  Patient tired appearing, affect is slightly flattened, slow to follow commands.  Cranial Nerves: II: Peripheral visual fields grossly normal, pupils equal, round, reactive to light III,IV, VI: ptosis not present, extra-ocular motions intact bilaterally V,VII: smile symmetric, eyebrows raise symmetric, facial light touch sensation equal VIII: hearing grossly normal to voice X: uvula elevates symmetrically XI: bilateral shoulder shrug symmetric and strong XII: midline tongue extension without fassiculations Motor: Weakness of left upper and left lower extremity.  3/5 strength compared to right side with grip, push/pull, dorsi and plantar flexion.  Sensory: Sensation intact to light touch in all extremities.Negative Romberg.  Cerebellar: normal finger-to-nose with bilateral upper extremities. Normal heel-to -shin balance bilaterally of the lower extremity. No pronator drift.   Gait: Gait slow, slightly shuffling.  CV: distal pulses palpable throughout  Skin: Skin is warm and dry. Capillary refill takes less than 2 seconds.  Psychiatric: She has a normal mood and affect. Her behavior is normal.     ED Treatments / Results  Labs (all labs ordered are listed, but only abnormal results are displayed) Labs Reviewed  COMPREHENSIVE METABOLIC PANEL - Abnormal; Notable for the following components:      Result Value   Glucose, Bld 101 (*)    Creatinine, Ser 1.01 (*)    Calcium 8.7 (*)    All other components within normal limits  CBC WITH DIFFERENTIAL/PLATELET  URINALYSIS, ROUTINE W REFLEX MICROSCOPIC  RAPID URINE DRUG SCREEN, HOSP PERFORMED  TSH  T3, FREE  T4, FREE  I-STAT TROPONIN, ED  I-STAT BETA HCG BLOOD, ED ([facility], [facility], [facility] ONLY)    EKG EKG Interpretation  Date/Time:  Thursday November 10 2017 11:22:50 EDT Ventricular Rate:  92 PR Interval:  152 QRS Duration: 82 QT Interval:  344 QTC Calculation: 425 R Axis:   58 Text  Interpretation:  Normal sinus rhythm Nonspecific T wave abnormality Abnormal ECG Confirmed by Kristine Royal [phone]) on 11/10/2017 4:14:40 PM   Radiology Dg Chest 2 View  Result Date: 11/10/2017 CLINICAL DATA:  Mid chest pain.  Weakness EXAM: CHEST - 2 VIEW COMPARISON:  10/27/2015 FINDINGS: Low volume chest. There is no edema, consolidation, effusion, or pneumothorax. Normal heart size and mediastinal contours. IMPRESSION: No evidence of active disease. Electronically Signed   By: Marnee Spring M.D.   On: 11/10/2017 12:03   Ct Head Wo Contrast  Result Date: 11/10/2017 CLINICAL DATA:  27 y/o F; weakness in the hand, left leg, and left arm. EXAM: CT HEAD WITHOUT CONTRAST TECHNIQUE: Contiguous axial images were obtained from the base of the skull through the vertex without intravenous contrast. COMPARISON:  10/26/2017 CT head FINDINGS: Brain: No evidence of acute infarction, hemorrhage, hydrocephalus, extra-axial collection or mass lesion/mass effect. Vascular: No hyperdense vessel or unexpected calcification. Skull: Normal. Negative for fracture or focal lesion. Sinuses/Orbits: Right maxillary sinus mucous retention cyst. Normal aeration of mastoid air cells. Visible orbits are unremarkable. Other: None. IMPRESSION: Stable negative CT of the head. Electronically Signed   By: Mitzi Hansen M.D.   On: 11/10/2017 15:44    Procedures Procedures (including critical care time)  Medications Ordered in ED Medications  sodium chloride 0.9 % bolus 500 mL (has no administration in time range)  diphenhydrAMINE (BENADRYL) injection 12.5 mg (has no administration in time range)  metoCLOPramide (REGLAN) injection 10 mg (has no administration in time range)  acetaminophen (TYLENOL) tablet 325 mg (has no administration in time range)     Initial Impression / Assessment and Plan / ED Course  I have reviewed the triage vital signs and the nursing notes.  Pertinent labs & imaging results that were  available during my care of the patient were reviewed by me and considered in my medical decision making (see chart for details).  Clinical Course as of Nov 10 1745  Thu Nov 10, 2017  1634 Spoke Neurology On-Call who recommends MRI/MRA of brain as well as MRI of Cspine.   [BM]  1733 Handoff given to Felicie Morn NP.   [BM]  [phone] Patient ambulatory in ER to bathroom, still with  a slow gait no acute distress.   [BM]    Clinical Course User Index [BM] Bill Salinas, PA-C   Patient presenting with multiple symptoms including left-sided weakness, chest pain and left upper arm swelling.  Troponin negative x2, itchy read by Dr. Rodena Medin, chest x-ray negative, reproducible with palpation of the chest.  CT head negative.  Consult with neurology recommends multiple MRIs of the head and neck.  Awaiting results of those at this time.  Patient given migraine cocktail in hopes to improve her headache.  Ultrasound of left upper extremity to evaluate for blood clot pending at this time.  Handoff given to Felicie Morn, NP at shift change.  Plan at this time is to await results of MRI and follow-up with neurology and follow recommendations regarding left-sided weakness.,  Continue cardiac monitoring for complaint of chest pain.  And evaluate significance of left upper arm swelling, ultrasound for blood clot.   Final Clinical Impressions(s) / ED Diagnoses   Final diagnoses:  None    ED Discharge Orders    None       Elizabeth Palau 11/10/17 1752    Tegeler, Canary Brim, MD 11/15/17 [phone]

## 2017-11-10 NOTE — ED Notes (Signed)
Patient transported to CT 

## 2017-11-10 NOTE — ED Notes (Signed)
Patient transported to MRI 

## 2017-11-11 ENCOUNTER — Encounter: Payer: Self-pay | Admitting: Neurology

## 2017-11-11 LAB — T3, FREE: T3, Free: 2.8 pg/mL (ref 2.0–4.4)

## 2017-11-17 ENCOUNTER — Encounter: Payer: Self-pay | Admitting: Neurology

## 2017-11-17 ENCOUNTER — Other Ambulatory Visit: Payer: Self-pay

## 2017-11-17 ENCOUNTER — Ambulatory Visit: Payer: Medicaid Other | Admitting: Neurology

## 2017-11-17 DIAGNOSIS — R200 Anesthesia of skin: Secondary | ICD-10-CM

## 2017-11-17 DIAGNOSIS — R930 Abnormal findings on diagnostic imaging of skull and head, not elsewhere classified: Secondary | ICD-10-CM

## 2017-11-17 DIAGNOSIS — G950 Syringomyelia and syringobulbia: Secondary | ICD-10-CM | POA: Diagnosis not present

## 2017-11-17 DIAGNOSIS — G44309 Post-traumatic headache, unspecified, not intractable: Secondary | ICD-10-CM | POA: Insufficient documentation

## 2017-11-17 DIAGNOSIS — G44321 Chronic post-traumatic headache, intractable: Secondary | ICD-10-CM | POA: Diagnosis not present

## 2017-11-17 MED ORDER — LEVETIRACETAM 750 MG PO TABS: 750.0000 mg | ORAL_TABLET | Freq: Two times a day (BID) | ORAL | 3 refills | Status: DC
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
## 2017-12-15 ENCOUNTER — Encounter: Payer: Self-pay | Admitting: Neurology

## 2017-12-15 NOTE — Progress Notes (Deleted)
NEUROLOGY CONSULTATION NOTE  Samantha Patrick MRN: [id number] DOB: [birth year]  Referring provider: *** Primary care provider: ***  Reason for consult:  Left sided numbness, headache, questionable MS  HISTORY OF PRESENT ILLNESS: Samantha Patrick is a 27 year old ***  PAST MEDICAL HISTORY: Past Medical History:  Diagnosis Date  . Medical history non-contributory     PAST SURGICAL HISTORY: Past Surgical History:  Procedure Laterality Date  . NO PAST SURGERIES      MEDICATIONS: Current Outpatient Medications on File Prior to Visit  Medication Sig Dispense Refill  . amitriptyline (ELAVIL) 50 MG tablet Take 50 mg by mouth at bedtime.  0  . aspirin EC 81 MG tablet Take 81 mg by mouth every 6 (six) hours as needed for mild pain or fever.    . levETIRAcetam (KEPPRA) 750 MG tablet Take 1 tablet (750 mg total) by mouth 2 (two) times daily. 60 tablet 3  . meloxicam (MOBIC) 15 MG tablet Take 15 mg by mouth daily.  0   No current facility-administered medications on file prior to visit.     ALLERGIES: No Known Allergies  FAMILY HISTORY: Family History  Problem Relation Age of Onset  . Diabetes Mother   . Heart disease Maternal Grandmother   . Healthy Father    ***.  SOCIAL HISTORY: Social History   Socioeconomic History  . Marital status: Single    Spouse name: Not on file  . Number of children: Not on file  . Years of education: Not on file  . Highest education level: Not on file  Occupational History  . Not on file  Social Needs  . Financial resource strain: Not on file  . Food insecurity:    Worry: Not on file    Inability: Not on file  . Transportation needs:    Medical: Not on file    Non-medical: Not on file  Tobacco Use  . Smoking status: Never Smoker  . Smokeless tobacco: Never Used  Substance and Sexual Activity  . Alcohol use: Not Currently    Frequency: Never  . Drug use: No  . Sexual activity: Yes    Birth control/protection: None  Lifestyle    . Physical activity:    Days per week: Not on file    Minutes per session: Not on file  . Stress: Not on file  Relationships  . Social connections:    Talks on phone: Not on file    Gets together: Not on file    Attends religious service: Not on file    Active member of club or organization: Not on file    Attends meetings of clubs or organizations: Not on file    Relationship status: Not on file  . Intimate partner violence:    Fear of current or ex partner: Not on file    Emotionally abused: Not on file    Physically abused: Not on file    Forced sexual activity: Not on file  Other Topics Concern  . Not on file  Social History Narrative  . Not on file    REVIEW OF SYSTEMS: Constitutional: No fevers, chills, or sweats, no generalized fatigue, change in appetite Eyes: No visual changes, double vision, eye pain Ear, nose and throat: No hearing loss, ear pain, nasal congestion, sore throat Cardiovascular: No chest pain, palpitations Respiratory:  No shortness of breath at rest or with exertion, wheezes GastrointestinaI: No nausea, vomiting, diarrhea, abdominal pain, fecal incontinence Genitourinary:  No dysuria, urinary retention or  frequency Musculoskeletal:  No neck pain, back pain Integumentary: No rash, pruritus, skin lesions Neurological: as above Psychiatric: No depression, insomnia, anxiety Endocrine: No palpitations, fatigue, diaphoresis, mood swings, change in appetite, change in weight, increased thirst Hematologic/Lymphatic:  No purpura, petechiae. Allergic/Immunologic: no itchy/runny eyes, nasal congestion, recent allergic reactions, rashes  PHYSICAL EXAM: *** General: No acute distress.  Patient appears ***-groomed.  *** Head:  Normocephalic/atraumatic Eyes:  fundi examined but not visualized Neck: supple, no paraspinal tenderness, full range of motion Back: No paraspinal tenderness Heart: regular rate and rhythm Lungs: Clear to auscultation  bilaterally. Vascular: No carotid bruits. Neurological Exam: Mental status: alert and oriented to person, place, and time, recent and remote memory intact, fund of knowledge intact, attention and concentration intact, speech fluent and not dysarthric, language intact. Cranial nerves: CN I: not tested CN II: pupils equal, round and reactive to light, visual fields intact CN III, IV, VI:  full range of motion, no nystagmus, no ptosis CN V: facial sensation intact CN VII: upper and lower face symmetric CN VIII: hearing intact CN IX, X: gag intact, uvula midline CN XI: sternocleidomastoid and trapezius muscles intact CN XII: tongue midline Bulk & Tone: normal, no fasciculations. Motor:  5/5 throughout *** Sensation:  Pinprick *** temperature *** and vibration sensation intact.  ***. Deep Tendon Reflexes:  2+ throughout, *** toes downgoing.  *** Finger to nose testing:  Without dysmetria.  *** Heel to shin:  Without dysmetria.  *** Gait:  Normal station and stride.  Able to turn and tandem walk. Romberg ***.  IMPRESSION: ***  PLAN: ***  Thank you for allowing me to take part in the care of this patient.  Shon Millet, DO  CC: ***

## 2017-12-16 ENCOUNTER — Ambulatory Visit: Payer: Self-pay | Admitting: Neurology

## 2018-01-04 ENCOUNTER — Encounter

## 2018-01-04 ENCOUNTER — Ambulatory Visit: Payer: Self-pay | Admitting: Neurology

## 2018-01-10 ENCOUNTER — Encounter

## 2018-01-10 ENCOUNTER — Encounter: Payer: Medicaid Other | Admitting: Neurology

## 2018-01-11 ENCOUNTER — Encounter: Payer: Self-pay | Admitting: Neurology

## 2018-06-04 IMAGING — US US OB TRANSVAGINAL
1 series · 14 of 28 positions shown · non-contrast
Comparison: None available.

CLINICAL DATA: Initial evaluation for acute abdominal cramping.

EXAM:
OBSTETRIC <14 WK ULTRASOUND
TECHNIQUE: Transabdominal ultrasound was performed for evaluation of the
gestation as well as the maternal uterus and adnexal regions.

[Series 1: us ob transvaginal · 0.23mm/px · 14 of 42 slices shown]
[im 2/42]
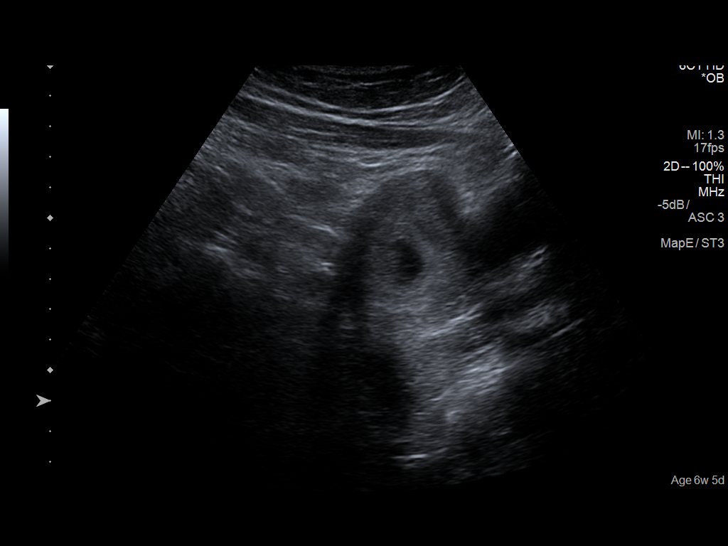
[im 5/42]
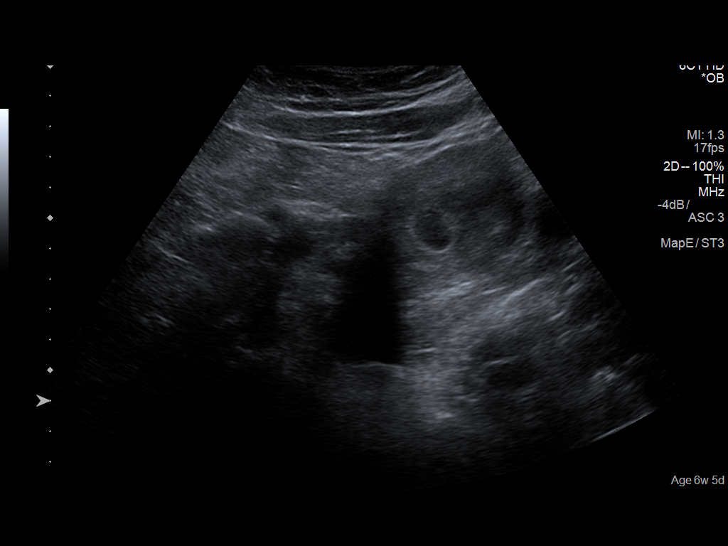
[im 8/42]
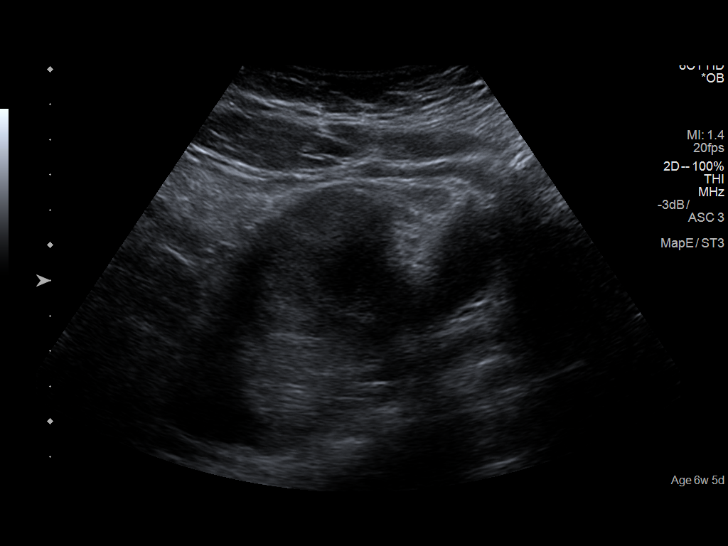
[im 11/42]
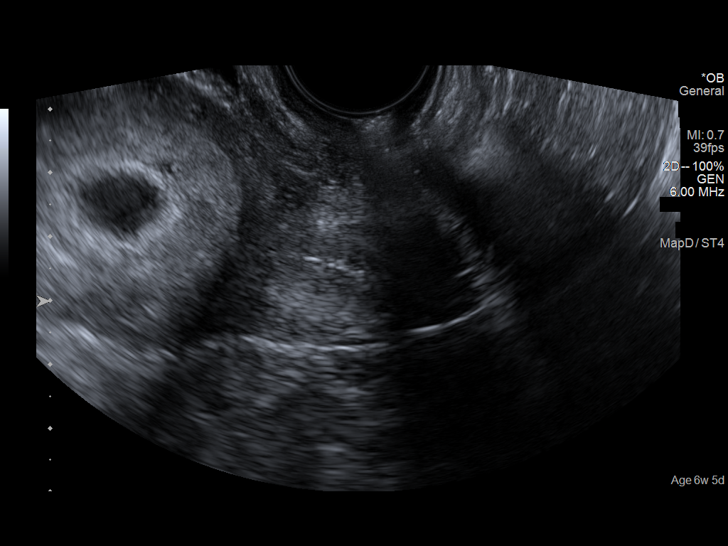
[im 14/42]
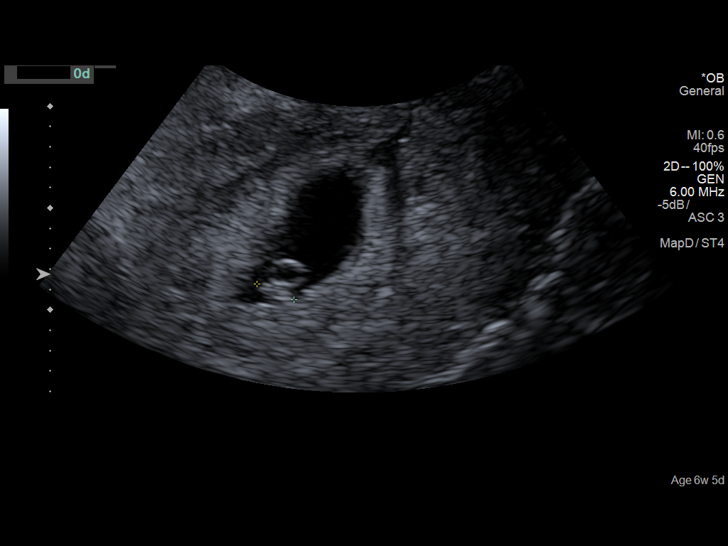
[im 17/42]
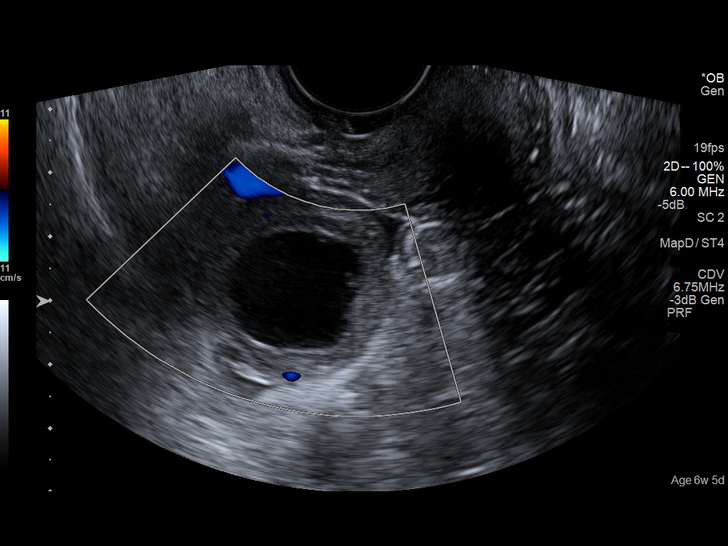
[im 20/42]
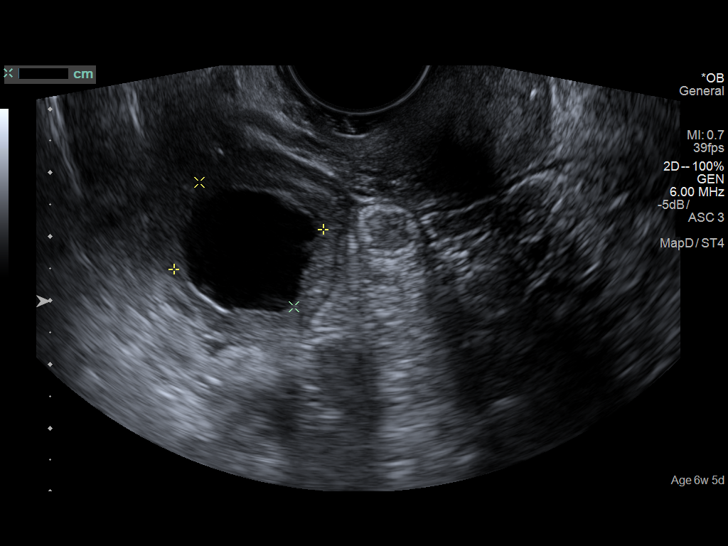
[im 23/42]
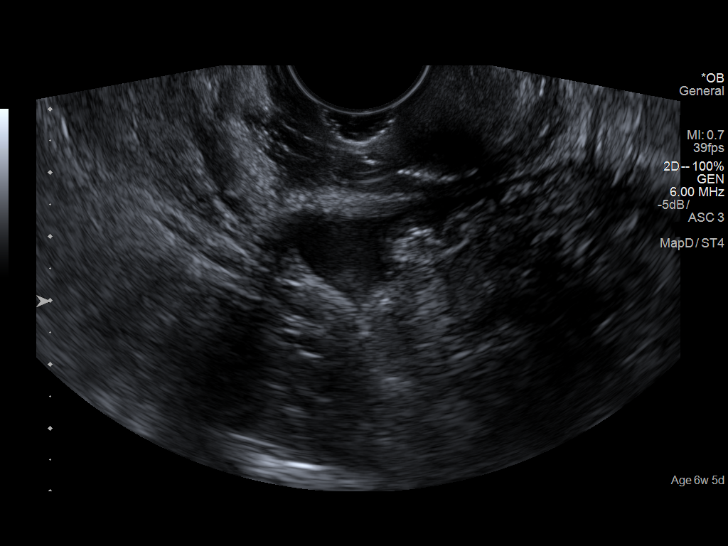
[im 26/42]
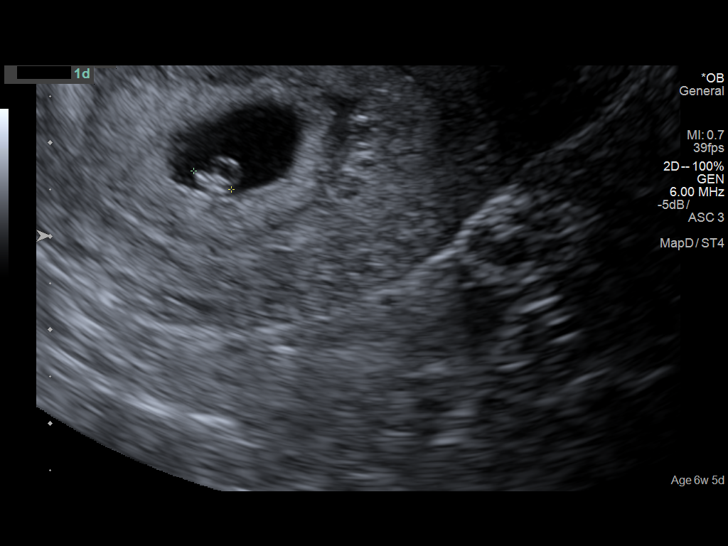
[im 29/42]
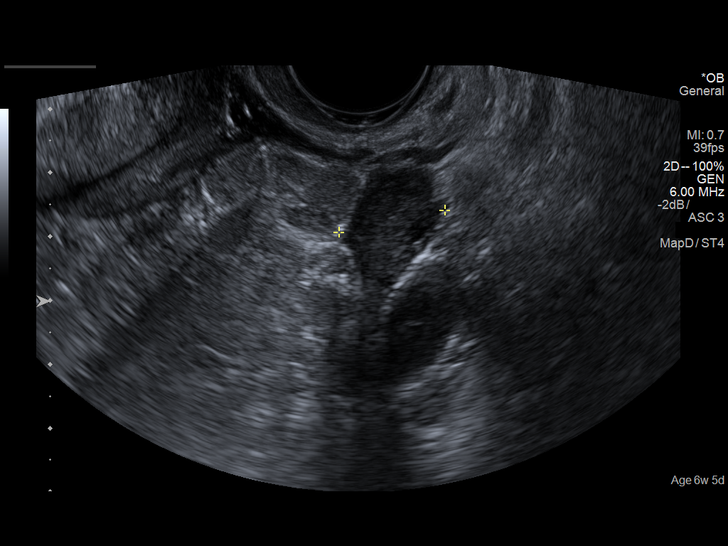
[im 32/42]
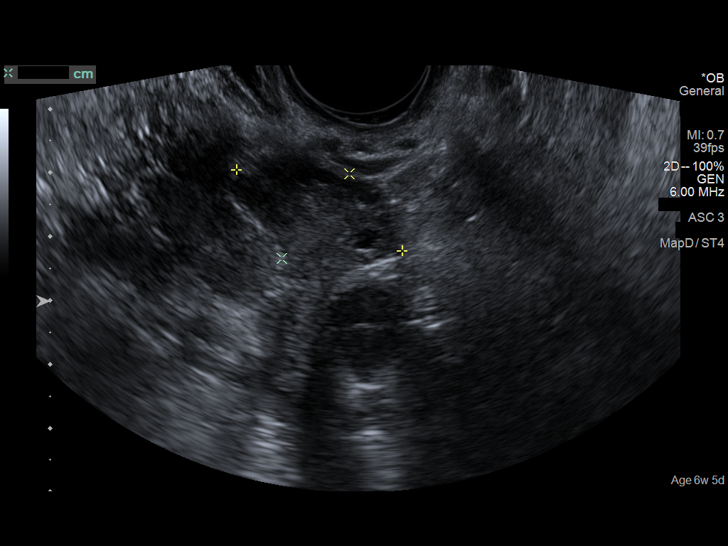
[im 35/42]
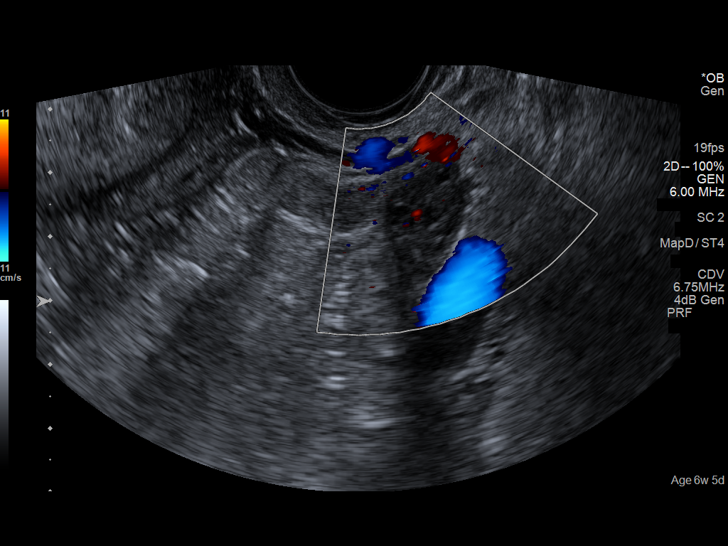
[im 38/42]
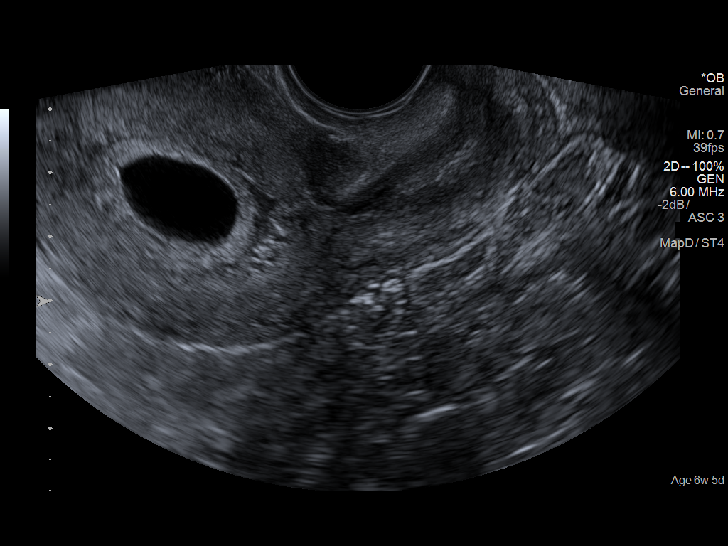
[im 42/42]
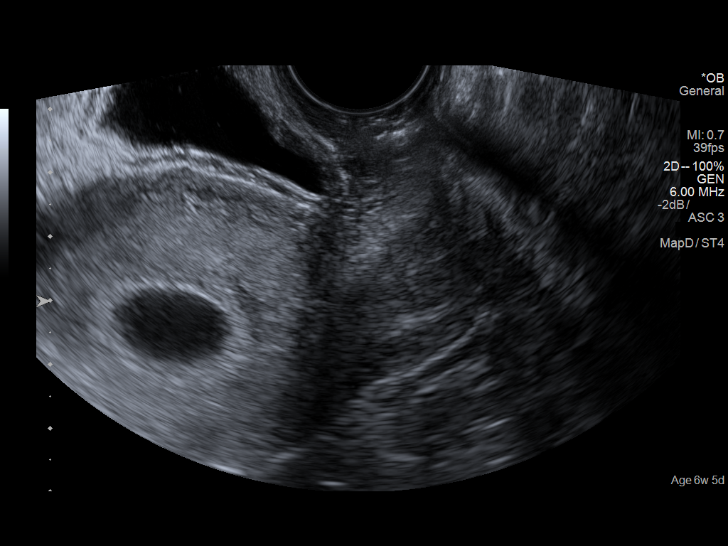

[14 of 28 positions shown; findings below may reference images not displayed]

FINDINGS: Intrauterine gestational sac: Single

Yolk sac:  Present

Embryo:  Present

Cardiac Activity: Present

Heart Rate: 113 bpm

MSD:   mm    w     d

CRL:   5.1  mm   6 w to d                  US EDC: 09/29/2016

Subchorionic hemorrhage:  None visualized.

Maternal uterus/adnexae: Normal appearance of the left ovary. 2.4 x
2.5 x 2.2 cm cyst present within the right ovary, likely a normal
physiologic cyst. No associated vascularity to suggest heterotopic
pregnancy. Small volume free fluid within the right at adnexa,
likely physiologic.
IMPRESSION: 1. Single viable IUP as above without complication.
2. 2.4 x 2.5 x 2.2 cm right ovarian cyst, most consistent with a
normal physiologic cyst. Associated small volume free physiologic
fluid within the right adnexa.

## 2020-10-08 ENCOUNTER — Other Ambulatory Visit: Payer: Self-pay

## 2022-02-01 ENCOUNTER — Ambulatory Visit: Payer: Self-pay | Admitting: *Deleted

## 2022-02-01 NOTE — Telephone Encounter (Signed)
Third attempt to reach pt, recording.Marland KitchenMarland Kitchen"Not in service"

## 2022-02-01 NOTE — Telephone Encounter (Signed)
  Pt is calling to report she went to [facility] 2 days ago and reporting strept throat. Pt does not have PCP and would like to know when does she need to go to the ED for her strept throat     Attempted to reach, recording "Not in service."

## 2022-02-01 NOTE — Telephone Encounter (Signed)
Pt called, unable to LVM d/t "not in service" recording.

## 2022-10-01 ENCOUNTER — Ambulatory Visit: Payer: Self-pay
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
## 2022-10-02 ENCOUNTER — Other Ambulatory Visit: Payer: Self-pay

## 2022-10-02 ENCOUNTER — Emergency Department (HOSPITAL_BASED_OUTPATIENT_CLINIC_OR_DEPARTMENT_OTHER)
Admission: EM | Admit: 2022-10-02 | Discharge: 2022-10-02 | Disposition: A | Discharge status: Home / Self Care | Payer: Medicaid Other | Attending: Emergency Medicine | Admitting: Emergency Medicine

## 2022-10-02 DIAGNOSIS — Z7982 Long term (current) use of aspirin: Secondary | ICD-10-CM | POA: Diagnosis not present

## 2022-10-02 DIAGNOSIS — S0990XA Unspecified injury of head, initial encounter: Secondary | ICD-10-CM | POA: Insufficient documentation

## 2022-10-02 DIAGNOSIS — W228XXA Striking against or struck by other objects, initial encounter: Secondary | ICD-10-CM | POA: Insufficient documentation

## 2022-10-02 MED ORDER — NAPROXEN 500 MG PO TABS: 500.0000 mg | ORAL_TABLET | Freq: Two times a day (BID) | ORAL | 0 refills | Status: DC

## 2022-10-02 MED ORDER — DIPHENHYDRAMINE HCL 50 MG/ML IJ SOLN
12.5000 mg | Freq: Once | INTRAMUSCULAR | Status: AC
  Administered 2022-10-02: 12.5 mg via INTRAMUSCULAR
  Filled 2022-10-02: qty 1

## 2022-10-02 MED ORDER — PROCHLORPERAZINE EDISYLATE 10 MG/2ML IJ SOLN
10.0000 mg | Freq: Once | INTRAMUSCULAR | Status: AC
  Administered 2022-10-02: 10 mg via INTRAMUSCULAR
  Filled 2022-10-02: qty 2
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
## 2022-10-02 NOTE — Discharge Instructions (Addendum)
It was a pleasure taking care of you today.  As discussed, your exam was reassuring.  You could have sustained a concussion.  Limit screen time over the next few days.  I am sending you home with pain medication.  Take as needed for your headache.  Do not take medication if there is a chance you could be pregnant.  Please follow-up with PCP if symptoms do not improve over the next few days.  Return to the ER for new or worsening symptoms.
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
## 2023-10-28 ENCOUNTER — Emergency Department (HOSPITAL_BASED_OUTPATIENT_CLINIC_OR_DEPARTMENT_OTHER)
Admission: EM | Admit: 2023-10-28 | Discharge: 2023-10-28 | Disposition: A | Discharge status: Home / Self Care | Attending: Emergency Medicine | Admitting: Emergency Medicine

## 2023-10-28 ENCOUNTER — Encounter (HOSPITAL_BASED_OUTPATIENT_CLINIC_OR_DEPARTMENT_OTHER): Payer: Self-pay

## 2023-10-28 ENCOUNTER — Other Ambulatory Visit: Payer: Self-pay

## 2023-10-28 ENCOUNTER — Emergency Department (HOSPITAL_BASED_OUTPATIENT_CLINIC_OR_DEPARTMENT_OTHER)

## 2023-10-28 DIAGNOSIS — R112 Nausea with vomiting, unspecified: Secondary | ICD-10-CM | POA: Insufficient documentation

## 2023-10-28 DIAGNOSIS — R1013 Epigastric pain: Secondary | ICD-10-CM | POA: Insufficient documentation

## 2023-10-28 DIAGNOSIS — Z7982 Long term (current) use of aspirin: Secondary | ICD-10-CM | POA: Insufficient documentation

## 2023-10-28 DIAGNOSIS — Z5329 Procedure and treatment not carried out because of patient's decision for other reasons: Secondary | ICD-10-CM | POA: Insufficient documentation

## 2023-10-28 DIAGNOSIS — K819 Cholecystitis, unspecified: Secondary | ICD-10-CM

## 2023-10-28 DIAGNOSIS — R1011 Right upper quadrant pain: Secondary | ICD-10-CM | POA: Insufficient documentation

## 2023-10-28 LAB — CBC WITH DIFFERENTIAL/PLATELET
Abs Immature Granulocytes: 0.05 K/uL (ref 0.00–0.07)
Basophils Absolute: 0 K/uL (ref 0.0–0.1)
Basophils Relative: 1 %
Eosinophils Absolute: 0.2 K/uL (ref 0.0–0.5)
Eosinophils Relative: 3 %
HCT: 28.1 % — ABNORMAL LOW (ref 36.0–46.0)
Hemoglobin: 8.1 g/dL — ABNORMAL LOW (ref 12.0–15.0)
Immature Granulocytes: 1 %
Lymphocytes Relative: 29 %
Lymphs Abs: 2.2 K/uL (ref 0.7–4.0)
MCH: 19.8 pg — ABNORMAL LOW (ref 26.0–34.0)
MCHC: 28.8 g/dL — ABNORMAL LOW (ref 30.0–36.0)
MCV: 68.7 fL — ABNORMAL LOW (ref 80.0–100.0)
Monocytes Absolute: 0.4 K/uL (ref 0.1–1.0)
Monocytes Relative: 6 %
Neutro Abs: 4.6 K/uL (ref 1.7–7.7)
Neutrophils Relative %: 60 %
Platelets: 362 K/uL (ref 150–400)
RBC: 4.09 MIL/uL (ref 3.87–5.11)
RDW: 16.4 % — ABNORMAL HIGH (ref 11.5–15.5)
WBC: 7.6 K/uL (ref 4.0–10.5)
nRBC: 0 % (ref 0.0–0.2)

## 2023-10-28 LAB — URINALYSIS, W/ REFLEX TO CULTURE (INFECTION SUSPECTED)
Bacteria, UA: NONE SEEN
Bilirubin Urine: NEGATIVE
Glucose, UA: NEGATIVE mg/dL
Hgb urine dipstick: NEGATIVE
Ketones, ur: NEGATIVE mg/dL
Nitrite: NEGATIVE
Protein, ur: NEGATIVE mg/dL
Specific Gravity, Urine: 1.019 (ref 1.005–1.030)
pH: 6 (ref 5.0–8.0)

## 2023-10-28 LAB — COMPREHENSIVE METABOLIC PANEL WITH GFR
ALT: 6 U/L (ref 0–44)
AST: 14 U/L — ABNORMAL LOW (ref 15–41)
Albumin: 4 g/dL (ref 3.5–5.0)
Alkaline Phosphatase: 67 U/L (ref 38–126)
Anion gap: 11 (ref 5–15)
BUN: 6 mg/dL (ref 6–20)
CO2: 22 mmol/L (ref 22–32)
Calcium: 8.9 mg/dL (ref 8.9–10.3)
Chloride: 106 mmol/L (ref 98–111)
Creatinine, Ser: 0.85 mg/dL (ref 0.44–1.00)
GFR, Estimated: >60 mL/min (ref >60)
Glucose, Bld: 101 mg/dL — ABNORMAL HIGH (ref 70–99)
Potassium: 3.7 mmol/L (ref 3.5–5.1)
Sodium: 139 mmol/L (ref 135–145)
Total Bilirubin: 0.3 mg/dL (ref 0.0–1.2)
Total Protein: 7.5 g/dL (ref 6.5–8.1)

## 2023-10-28 LAB — HCG, SERUM, QUALITATIVE: Preg, Serum: NEGATIVE

## 2023-10-28 LAB — LIPASE, BLOOD: Lipase: 35 U/L (ref 11–51)

## 2023-10-28 MED ORDER — HYDROMORPHONE HCL 1 MG/ML IJ SOLN: 0.5000 mg | Freq: Once | INTRAMUSCULAR | Status: DC

## 2023-10-28 MED ORDER — ONDANSETRON HCL 4 MG/2ML IJ SOLN
4.0000 mg | Freq: Once | INTRAMUSCULAR | Status: AC
  Administered 2023-10-28: 4 mg via INTRAVENOUS
  Filled 2023-10-28: qty 2

## 2023-10-28 MED ORDER — LACTATED RINGERS IV SOLN: INTRAVENOUS | Status: DC

## 2023-10-28 MED ORDER — SODIUM CHLORIDE 0.9 % IV SOLN
2.0000 g | Freq: Once | INTRAVENOUS | Status: DC
  Filled 2023-10-28: qty 20

## 2023-10-28 MED ORDER — MORPHINE SULFATE (PF) 4 MG/ML IV SOLN
6.0000 mg | Freq: Once | INTRAVENOUS | Status: AC
  Administered 2023-10-28: 6 mg via INTRAVENOUS
  Filled 2023-10-28: qty 2

## 2023-10-28 NOTE — ED Notes (Signed)
 Pt states that she was diagnosed with a UTI today and prescribed an antibiotic, which she took today for the first time prior to symptoms

## 2023-10-28 NOTE — ED Provider Notes (Addendum)
 [place] EMERGENCY DEPARTMENT AT [facility] Provider Note   CSN: [id number] Arrival date & time: 10/28/23  1856     Patient presents with: Abdominal Pain   Samantha Patrick is a 33 y.o. female.   33 year old female presents acute onset right upper quadrant pain after eating fatty sweet food.  States the pain starts epigastric area goes her upper quadrant.  Has had some nausea and vomiting with no fever or chills.  No GYN symptoms.  No diarrhea.  Pain is characterized as a burning sensation.  No treatment use prior to arrival.  No treatment prior to arrival       Prior to Admission medications   Medication Sig Start Date End Date Taking? Authorizing Provider  amitriptyline (ELAVIL) 50 MG tablet Take 50 mg by mouth at bedtime. 11/03/17   [provider]  aspirin EC 81 MG tablet Take 81 mg by mouth every 6 (six) hours as needed for mild pain or fever.    [provider]  levETIRAcetam  (KEPPRA ) 750 MG tablet Take 1 tablet (750 mg total) by mouth 2 (two) times daily. 11/17/17   Sater, Charlie LABOR, MD  meloxicam (MOBIC) 15 MG tablet Take 15 mg by mouth daily. 11/03/17   [provider]  naproxen  (NAPROSYN ) 500 MG tablet Take 1 tablet (500 mg total) by mouth 2 (two) times daily. 10/02/22   Aberman, Caroline C, PA-C    Allergies: Patient has no known allergies.    Review of Systems  All other systems reviewed and are negative.   Updated Vital Signs BP (!) 142/101   Pulse 81   Temp 98.2 F (36.8 C) (Oral)   Resp 16   LMP 10/18/2023 (Approximate)   SpO2 96%   Physical Exam Vitals and nursing note reviewed.  Constitutional:      General: She is not in acute distress.    Appearance: Normal appearance. She is well-developed. She is not toxic-appearing.  HENT:     Head: Normocephalic and atraumatic.  Eyes:     General: Lids are normal.     Conjunctiva/sclera: Conjunctivae normal.     Pupils: Pupils are equal, round, and reactive to light.  Neck:      Thyroid : No thyroid  mass.     Trachea: No tracheal deviation.  Cardiovascular:     Rate and Rhythm: Normal rate and regular rhythm.     Heart sounds: Normal heart sounds. No murmur heard.    No gallop.  Pulmonary:     Effort: Pulmonary effort is normal. No respiratory distress.     Breath sounds: Normal breath sounds. No stridor. No decreased breath sounds, wheezing, rhonchi or rales.  Abdominal:     General: There is no distension.     Palpations: Abdomen is soft.     Tenderness: There is abdominal tenderness in the right upper quadrant and epigastric area. There is guarding. There is no rebound.   Musculoskeletal:        General: No tenderness. Normal range of motion.     Cervical back: Normal range of motion and neck supple.  Skin:    General: Skin is warm and dry.     Findings: No abrasion or rash.  Neurological:     Mental Status: She is alert and oriented to person, place, and time. Mental status is at baseline.     GCS: GCS eye subscore is 4. GCS verbal subscore is 5. GCS motor subscore is 6.     Cranial Nerves: No cranial  nerve deficit.     Sensory: No sensory deficit.     Motor: Motor function is intact.  Psychiatric:        Attention and Perception: Attention normal.        Speech: Speech normal.        Behavior: Behavior normal.     (all labs ordered are listed, but only abnormal results are displayed) Labs Reviewed  CBC WITH DIFFERENTIAL/PLATELET  COMPREHENSIVE METABOLIC PANEL WITH GFR  LIPASE, BLOOD  HCG, SERUM, QUALITATIVE  URINALYSIS, W/ REFLEX TO CULTURE (INFECTION SUSPECTED)    EKG: None  Radiology: No results found.   Procedures   Medications Ordered in the ED  lactated ringers  infusion (has no administration in time range)  morphine  (PF) 4 MG/ML injection 6 mg (has no administration in time range)  ondansetron  (ZOFRAN ) injection 4 mg (has no administration in time range)                                    Medical Decision  Making Amount and/or Complexity of Data Reviewed Radiology: ordered.  Risk Prescription drug management.   Patient medicated for pain here concern for gallbladder etiology.  Ultrasound positive for gallstones.  Lipase is normal.  LFTs are not elevated.  Patient has pain despite being medicated with morphine .  Will repeat analgesics at this time.  Patient started on IV antibiotics with Rocephin  for cholecystitis.  Will consult general surgery for admission  10:06 PM Was informed that patient is requesting to leave at this time.  States that she is to go home and take care of family business.  States that she would go to [facility].  She is with her mother at this time.  She has signed AMA form     Final diagnoses:  None    ED Discharge Orders     None          Dasie Faden, MD 10/28/23 2149    Dasie Faden, MD 10/28/23 2207

## 2023-10-28 NOTE — ED Triage Notes (Signed)
 Pt states that she had sudden onset of epigastric pain that she describes as tightness. Denies nausea and vomiting, but does endorse diarrhea. Pain is constant.

## 2023-10-28 NOTE — ED Notes (Addendum)
 Pt refusing to be admitted for surgery. States she does not want to go to [facility] but will follow-up with [facility] for surgery. Pt signed AMA form and MD at bedside. Last VS WDL.

## 2023-10-29 ENCOUNTER — Other Ambulatory Visit: Payer: Self-pay

## 2023-10-29 ENCOUNTER — Encounter (HOSPITAL_COMMUNITY): Payer: Self-pay | Admitting: *Deleted

## 2023-10-29 ENCOUNTER — Emergency Department (HOSPITAL_COMMUNITY)
Admission: EM | Admit: 2023-10-29 | Discharge: 2023-10-29 | Disposition: A | Discharge status: Home / Self Care | Attending: Emergency Medicine | Admitting: Emergency Medicine

## 2023-10-29 DIAGNOSIS — R1084 Generalized abdominal pain: Secondary | ICD-10-CM

## 2023-10-29 DIAGNOSIS — K8050 Calculus of bile duct without cholangitis or cholecystitis without obstruction: Secondary | ICD-10-CM | POA: Diagnosis not present

## 2023-10-29 DIAGNOSIS — K8080 Other cholelithiasis without obstruction: Secondary | ICD-10-CM

## 2023-10-29 DIAGNOSIS — R109 Unspecified abdominal pain: Secondary | ICD-10-CM | POA: Diagnosis present

## 2023-10-29 MED ORDER — MORPHINE SULFATE (PF) 4 MG/ML IV SOLN
4.0000 mg | Freq: Once | INTRAVENOUS | Status: AC
  Administered 2023-10-29: 4 mg via INTRAVENOUS
  Filled 2023-10-29: qty 1

## 2023-10-29 MED ORDER — ONDANSETRON HCL 4 MG PO TABS: 4.0000 mg | ORAL_TABLET | Freq: Four times a day (QID) | ORAL | 0 refills | Status: AC

## 2023-10-29 MED ORDER — LACTATED RINGERS IV BOLUS
1000.0000 mL | Freq: Once | INTRAVENOUS | Status: AC
  Administered 2023-10-29: 1000 mL via INTRAVENOUS

## 2023-10-29 MED ORDER — ONDANSETRON HCL 4 MG/2ML IJ SOLN
4.0000 mg | Freq: Once | INTRAMUSCULAR | Status: AC
  Administered 2023-10-29: 4 mg via INTRAVENOUS
  Filled 2023-10-29: qty 2

## 2023-10-29 MED ORDER — CELECOXIB 200 MG PO CAPS: 200.0000 mg | ORAL_CAPSULE | Freq: Two times a day (BID) | ORAL | 0 refills | Status: AC

## 2023-10-29 NOTE — ED Notes (Signed)
Pt given saltine crackers and ginger ale 

## 2023-10-29 NOTE — ED Notes (Signed)
 Reassessed pt and updated vitals after pt was complaining to staff in lobby that she was upset because she was transferred here from [facility] ED and still waiting in lobby.  This RN was part of care team at other facility so pt was familiar and comfortable. Explained at length that the planned transfer earlier was nul when pt left against medical advice due to liability reasons that were discussed with her prior to her leaving. Pt assured that as soon as a room was available, she would be seen and reassessed so that care could continue. Pt offered ibuprofen  and/or Tylenol ,  but states she will wait. Pt expressed verbal understanding and appreciation of explanation. Encouraged to make staff aware of change/worsening in condition or other concerns should they arise. Will make charge RN aware

## 2023-10-29 NOTE — Consult Note (Signed)
 Samantha Patrick 04-21-1990  [id number].    Requesting MD: Countryman Chief Complaint/Reason for Consult: Cholelithiasis  HPI:  33 y/o F w/ a hx of obesity and chronic anemia who initially presented to [facility] with sudden onset abdominal pain. She reports that she ate a hostess treat and then developed epigastric pain that failed to resolve. An US  was performed and showed cholelithiasis without wall thickening or pericholecystic fluid. She was advised to transfer to [facility] for further evaluation, however, the patient left AMA and then later presented to [facility] on her own.  On exam, patient is resting comfortably in bed. She reports that she has never had symptoms like this before but her pain has resolved and she denies current complaints.  Labs notable for a WBC 8, Hb 8. Tbili/LFTs WNL  ROS: Review of Systems  Constitutional: Negative.   HENT: Negative.    Eyes: Negative.   Respiratory: Negative.    Cardiovascular: Negative.   Gastrointestinal: Negative.   Genitourinary: Negative.   Musculoskeletal: Negative.   Skin: Negative.   Neurological: Negative.   Endo/Heme/Allergies: Negative.   Psychiatric/Behavioral: Negative.      Family History  Problem Relation Age of Onset   Diabetes Mother    Heart disease Maternal Grandmother    Healthy Father     Past Medical History:  Diagnosis Date   Medical history non-contributory     Past Surgical History:  Procedure Laterality Date   NO PAST SURGERIES      Social History:  reports that she has never smoked. She has never used smokeless tobacco. She reports that she does not currently use alcohol. She reports that she does not use drugs.  Allergies: No Known Allergies  (Not in a hospital admission)   Physical Exam: Blood pressure (!) 153/92, pulse 78, temperature 98.2 F (36.8 C), resp. rate 16, height 5' 3 (1.6 m), weight 108.9 kg, last menstrual period 10/18/2023, SpO2 100%, unknown if currently breastfeeding. Gen:  female, NAD Abd: soft, non-distended, mild TTP in the epigastric region, no rebound/guarding, no peritoneal signs  Results for orders placed or performed during the hospital encounter of 10/28/23 (from the past 48 hours)  CBC with Differential     Status: Abnormal   Collection Time: 10/28/23  7:30 PM  Result Value Ref Range   WBC 7.6 4.0 - 10.5 K/uL   RBC 4.09 3.87 - 5.11 MIL/uL   Hemoglobin 8.1 (L) 12.0 - 15.0 g/dL   HCT 71.8 (L) 63.9 - 53.9 %   MCV 68.7 (L) 80.0 - 100.0 fL   MCH 19.8 (L) 26.0 - 34.0 pg   MCHC 28.8 (L) 30.0 - 36.0 g/dL   RDW 83.5 (H) 88.4 - 84.4 %   Platelets 362 150 - 400 K/uL    Comment: SPECIMEN CHECKED FOR CLOTS REPEATED TO VERIFY    nRBC 0.0 0.0 - 0.2 %   Neutrophils Relative % 60 %   Neutro Abs 4.6 1.7 - 7.7 K/uL   Lymphocytes Relative 29 %   Lymphs Abs 2.2 0.7 - 4.0 K/uL   Monocytes Relative 6 %   Monocytes Absolute 0.4 0.1 - 1.0 K/uL   Eosinophils Relative 3 %   Eosinophils Absolute 0.2 0.0 - 0.5 K/uL   Basophils Relative 1 %   Basophils Absolute 0.0 0.0 - 0.1 K/uL   Immature Granulocytes 1 %   Abs Immature Granulocytes 0.05 0.00 - 0.07 K/uL    Comment: Performed at [facility], [address], [place], [place]  [address]  Comprehensive metabolic panel     Status: Abnormal   Collection Time: 10/28/23  7:30 PM  Result Value Ref Range   Sodium 139 135 - 145 mmol/L   Potassium 3.7 3.5 - 5.1 mmol/L   Chloride 106 98 - 111 mmol/L   CO2 22 22 - 32 mmol/L   Glucose, Bld 101 (H) 70 - 99 mg/dL    Comment: Glucose reference range applies only to samples taken after fasting for at least 8 hours.   BUN 6 6 - 20 mg/dL   Creatinine, Ser 9.14 0.44 - 1.00 mg/dL   Calcium 8.9 8.9 - 89.6 mg/dL   Total Protein 7.5 6.5 - 8.1 g/dL   Albumin 4.0 3.5 - 5.0 g/dL   AST 14 (L) 15 - 41 U/L   ALT 6 0 - 44 U/L   Alkaline Phosphatase 67 38 - 126 U/L   Total Bilirubin 0.3 0.0 - 1.2 mg/dL   GFR, Estimated >39 >39 mL/min    Comment: (NOTE) Calculated  using the CKD-EPI Creatinine Equation (2021)    Anion gap 11 5 - 15    Comment: Performed at [facility], [address] [place], [place] [address]  Lipase, blood     Status: None   Collection Time: 10/28/23  7:30 PM  Result Value Ref Range   Lipase 35 11 - 51 U/L    Comment: Performed at [facility], [address], [place], [place] [address]  hCG, serum, qualitative     Status: None   Collection Time: 10/28/23  7:30 PM  Result Value Ref Range   Preg, Serum NEGATIVE NEGATIVE    Comment:        THE SENSITIVITY OF THIS METHODOLOGY IS >10 mIU/mL. Performed at [facility], [address] [place], [place] [address]   Urinalysis, w/ Reflex to Culture (Infection Suspected) -Urine, Clean Catch     Status: Abnormal   Collection Time: 10/28/23  8:05 PM  Result Value Ref Range   Specimen Source URINE, CLEAN CATCH    Color, Urine YELLOW YELLOW   APPearance CLEAR CLEAR   Specific Gravity, Urine 1.019 1.005 - 1.030   pH 6.0 5.0 - 8.0   Glucose, UA NEGATIVE NEGATIVE mg/dL   Hgb urine dipstick NEGATIVE NEGATIVE   Bilirubin Urine NEGATIVE NEGATIVE   Ketones, ur NEGATIVE NEGATIVE mg/dL   Protein, ur NEGATIVE NEGATIVE mg/dL   Nitrite NEGATIVE NEGATIVE   Leukocytes,Ua MODERATE (A) NEGATIVE   RBC / HPF 0-5 0 - 5 RBC/hpf   WBC, UA 11-20 0 - 5 WBC/hpf    Comment:        Reflex urine culture not performed if WBC <=10, OR if Squamous epithelial cells >5. If Squamous epithelial cells >5 suggest recollection.    Bacteria, UA NONE SEEN NONE SEEN   Squamous Epithelial / HPF 11-20 0 - 5 /HPF   Mucus PRESENT     Comment: Performed at [facility], [address] [place], [place] [address]   US  Abdomen Limited RUQ (LIVER/GB) Result Date: 10/28/2023 CLINICAL DATA:  151471 RUQ pain 151471 EXAM: ULTRASOUND ABDOMEN LIMITED RIGHT UPPER QUADRANT COMPARISON:  None Available. FINDINGS: Gallbladder: Multiple small gallstones.  No wall thickening or pericholecystic fluid. Positive sonographic Murphy's sign noted by sonographer. Common bile duct: Diameter: 3 mm Liver: Increased echogenicity. No focal lesion identified. No intrahepatic biliary ductal dilation. Portal vein is patent on color Doppler imaging with normal direction of blood flow towards the liver. Right Kidney:  Partially visualized. No mass. No hydronephrosis or nephrolithiasis. Other: None. IMPRESSION: Multiple small gallstones with a positive sonographic Murphy sign. In the absence of wall thickening and pericholecystic fluid, the study is equivocal for acute cholecystitis. These changes could be related to upper abdominal inflammation, acute hepatitis, hypoproteinemia, or volume overload. Laboratory correlation recommended. If concern persists for acute cholecystitis, a nuclear medicine hepatobiliary scan could be considered. Electronically Signed   By: Rogelia Myers M.D.   On: 10/28/2023 20:44    Assessment/Plan 33 y/o F who presented with post-prandial abdominal pain  Her symptoms have resolved and she does not have labs and imaging that are conclusive for cholecystitis. She may have symptomatic cholelithiasis, which can likely be managed as an outpatient. We discussed the anatomy and physiology of the biliary system as well as the significance of gallstones. She would be appropriate for a PO challenge in the ED. She can follow up as an outpatient to discuss elective cholecystectomy if desired.    Cordella DELENA Polly [facility] 10/29/2023, 6:25 AM Please see Amion for pager number during day hours 7:00am-4:30pm or 7:00am -11:30am on weekends

## 2023-10-29 NOTE — ED Triage Notes (Signed)
 Abd pain for 2-3 days  she went to [facility] and left from there .  She reports that she is supposed to be having surgery and were going to [place] but she wanted to come here.  She said that she was transferred but she signed out ama and came here  lmp 2 weeks  ago

## 2023-10-29 NOTE — ED Notes (Signed)
 Pt given discharge paperwork without any questions

## 2023-10-29 NOTE — ED Notes (Signed)
 Family called about this pt demanding to know why they were still in the lobby and not back in a room. Family stated that the pt had been transferred from another facility. Pt was not transferred, pt left other facility AMA in which pt was assigned as an admission and had bed assigned. Pt then  signed the AMA form and left.

## 2023-10-29 NOTE — ED Provider Notes (Signed)
 [facility] Provider Note   CSN: [id number] Arrival date & time: 10/29/23  0017     History Chief Complaint  Patient presents with   Abdominal Pain    HPI Samantha Patrick is a 33 y.o. female presenting for chief complaint of pain in her abdomen pelvis. States that it gets worse after she eats.  Seen yesterday and possible cholecycstitis. Told to transfer to [facility] but patient declined Now presenting for worsening symptoms Persistent pain.   Patient's recorded medical, surgical, social, medication list and allergies were reviewed in the Snapshot window as part of the initial history.   Review of Systems   Review of Systems  Constitutional:  Negative for chills and fever.  HENT:  Negative for ear pain and sore throat.   Eyes:  Negative for pain and visual disturbance.  Respiratory:  Negative for cough and shortness of breath.   Cardiovascular:  Positive for chest pain. Negative for palpitations.  Gastrointestinal:  Negative for abdominal pain and vomiting.  Genitourinary:  Negative for dysuria and hematuria.  Musculoskeletal:  Negative for arthralgias and back pain.  Skin:  Negative for color change and rash.  Neurological:  Negative for seizures and syncope.  All other systems reviewed and are negative.   Physical Exam Updated Vital Signs BP (!) 153/92   Pulse 78   Temp 98.2 F (36.8 C)   Resp 16   Ht 5' 3 (1.6 m)   Wt 108.9 kg   LMP 10/18/2023 (Approximate)   SpO2 100%   BMI 42.53 kg/m  Physical Exam   ED Course/ Medical Decision Making/ A&P    Procedures Procedures   Medications Ordered in ED Medications  lactated ringers  bolus 1,000 mL (1,000 mLs Intravenous New Bag/Given 10/29/23 0534)  ondansetron  (ZOFRAN ) injection 4 mg (4 mg Intravenous Given 10/29/23 0535)  morphine  (PF) 4 MG/ML injection 4 mg (4 mg Intravenous Given 10/29/23 0535)   MDM: Patient's history of present onset physical exam findings are most  consistent with cholelithiasis. Reviewed yesterday's lab work without evidence of  cholestasis. Reviewed yesterday's lab work without evidence of gross infection. Likely more representative of cholelithiasis with symptomatic. Discussed with general surgery on-call who will come evaluate patient at bedside for management of symptomatic cholelithiasis versus further workup for cholecystitis.  Reassessment:  General surgery told me that on their evaluation her pain was resolved and that they felt she was stable for outpatient care as long she was able to tolerate p.o. intake.  P.o. challenge attempted.  On my repeat evaluation, patient stated her pain was substantially better and she feels comfortable following up in the outpatient setting.  Referred to general surgery for outpatient evaluation with symptomatic management the outpatient setting.  Disposition:  I have considered need for hospitalization, however, considering all of the above, I believe this patient is stable for discharge at this time.  Patient/family educated about specific return precautions for given chief complaint and symptoms.  Patient/family educated about follow-up with PCP.     Patient/family expressed understanding of return precautions and need for follow-up. Patient spoken to regarding all imaging and laboratory results and appropriate follow up for these results. All education provided in verbal form with additional information in written form. Time was allowed for answering of patient questions. Patient discharged.    Emergency Department Medication Summary:   Medications  lactated ringers  bolus 1,000 mL (1,000 mLs Intravenous New Bag/Given 10/29/23 0534)  ondansetron  (ZOFRAN ) injection 4 mg (4 mg Intravenous  Given 10/29/23 0535)  morphine  (PF) 4 MG/ML injection 4 mg (4 mg Intravenous Given 10/29/23 0535)           Clinical Impression:  1. Biliary calculus of other site without obstruction   2. Generalized  abdominal pain      Data Unavailable   Final Clinical Impression(s) / ED Diagnoses Final diagnoses:  Biliary calculus of other site without obstruction  Generalized abdominal pain    Rx / DC Orders ED Discharge Orders          Ordered    celecoxib  (CELEBREX ) 200 MG capsule  2 times daily        10/29/23 0659    ondansetron  (ZOFRAN ) 4 MG tablet  Every 6 hours        10/29/23 0659              Jerral Meth, MD 10/29/23 [phone]

## 2023-11-14 ENCOUNTER — Ambulatory Visit: Payer: Self-pay | Admitting: Surgery

## 2023-11-24 ENCOUNTER — Ambulatory Visit: Admitting: Internal Medicine

## 2023-12-05 ENCOUNTER — Other Ambulatory Visit: Payer: Self-pay

## 2023-12-05 ENCOUNTER — Encounter (HOSPITAL_COMMUNITY): Payer: Self-pay | Admitting: Surgery

## 2023-12-05 NOTE — Progress Notes (Signed)
 SDW call  Patient was given pre-op instructions over the phone. Patient verbalized understanding of instructions provided.     PCP - [facility] Cardiologist - Denies Pulmonary: Denies   PPM/ICD - Denies Device Orders -  Rep Notified -    Chest x-ray - N/A EKG -  N/A Stress Test - Denies ECHO - Denies Cardiac Cath - Denies  Sleep Study/sleep apnea/CPAP:  Denies  Fasting Blood sugar range: Denies How often check sugars:  denies   Blood Thinner Instructions: Denies Aspirin Instructions: Denies   ERAS Protcol - Clears until 12:00 PRE-SURGERY Ensure or G2- N/A   COVID TEST- N/A     Anesthesia review: No   Patient denies shortness of breath, fever, cough and chest pain over the phone call  Surgical Instructions   Your procedure is scheduled on Wednesday, September 17th. Report to [facility] Main Entrance A at 12:00 P.M., then check in with the Admitting office. Any questions or running late day of surgery: call [phone]  Questions prior to your surgery date: call [phone], Monday-Friday, 8am-4pm. If you experience any cold or flu symptoms such as cough, fever, chills, shortness of breath, etc. between now and your scheduled surgery, please notify us  at the above number.     Remember:  Do not eat after midnight the night before your surgery.    You may drink clear liquids until 12:00 the morning of your surgery.   Clear liquids allowed are: Water, Non-Citrus Juices (without pulp), Clear Carbonated Beverages, Clear Tea (no milk, honey, etc.), Black Coffee Only (NO MILK, CREAM OR POWDERED CREAMER of any kind), and Gatorade.    Take these medicines the morning of surgery with A SIP OF WATER NONE   May take these medicines IF NEEDED:  celecoxib  (CELEBREX )    As of now, STOP taking any Aspirin (unless otherwise instructed by your surgeon) Aleve , Naproxen , Ibuprofen , Motrin , Advil , Goody's, BC's, all herbal medications, fish oil, and non-prescription  vitamins.                     Do NOT Smoke (Tobacco/Vaping) or drink for 24 hours prior to your procedure.  If you use a CPAP at night, you may bring your mask/headgear for your overnight stay.   You will be asked to remove any contacts, glasses, piercing's, hearing aid's, dentures/partials prior to surgery. Please bring cases for these items if needed.    Patients discharged the day of surgery will not be allowed to drive home, and someone needs to stay with them for 24 hours.     Pre-operative CHG Bathing Instructions    DO NOT use if you have an allergy to chlorhexidine /CHG or antibacterial soaps. If your skin becomes reddened or irritated, stop using the CHG and notify one of our RNs at [phone].              TAKE A SHOWER THE NIGHT BEFORE SURGERY AND THE DAY OF SURGERY    Please keep in mind the following:  DO NOT shave, including legs and underarms, 48 hours prior to surgery.   You may shave your face before/day of surgery.  Place clean sheets on your bed the night before surgery Use a clean washcloth and towel for each shower. DO NOT sleep with pet's night before surgery.  CHG Shower Instructions:  Wash with antibacterial soap  2. Pat dry with a clean towel  3. Put on clean pajamas    Additional instructions for the day of  surgery: DO NOT APPLY any lotions, deodorants, cologne, perfumes, or powders.   Do not wear jewelry or bring valuables to the hospital. [facility] is not responsible for valuables/personal belongings. Do not wear makeup, nail polish, gel polish, artificial nails, or any other type of covering on natural nails (fingers and toes) Put on clean/comfortable clothes.  Please brush your teeth.

## 2023-12-07 ENCOUNTER — Other Ambulatory Visit: Payer: Self-pay

## 2023-12-07 ENCOUNTER — Encounter (HOSPITAL_COMMUNITY): Payer: Self-pay | Admitting: Surgery

## 2023-12-07 ENCOUNTER — Ambulatory Visit (HOSPITAL_COMMUNITY): Admitting: Anesthesiology

## 2023-12-07 ENCOUNTER — Observation Stay (HOSPITAL_COMMUNITY)
Admission: RE | Admit: 2023-12-07 | Discharge: 2023-12-07 | Disposition: A | Discharge status: Home / Self Care | Attending: Surgery | Admitting: Surgery

## 2023-12-07 ENCOUNTER — Encounter (HOSPITAL_COMMUNITY)
Admission: RE | Disposition: A | Discharge status: Home / Self Care | Payer: Self-pay | Source: Home / Self Care | Attending: Surgery

## 2023-12-07 DIAGNOSIS — K8050 Calculus of bile duct without cholangitis or cholecystitis without obstruction: Secondary | ICD-10-CM | POA: Diagnosis not present

## 2023-12-07 DIAGNOSIS — K8010 Calculus of gallbladder with chronic cholecystitis without obstruction: Principal | ICD-10-CM | POA: Insufficient documentation

## 2023-12-07 DIAGNOSIS — E66813 Obesity, class 3: Secondary | ICD-10-CM

## 2023-12-07 DIAGNOSIS — Z6841 Body Mass Index (BMI) 40.0 and over, adult: Secondary | ICD-10-CM

## 2023-12-07 DIAGNOSIS — E119 Type 2 diabetes mellitus without complications: Secondary | ICD-10-CM | POA: Insufficient documentation

## 2023-12-07 DIAGNOSIS — K8020 Calculus of gallbladder without cholecystitis without obstruction: Principal | ICD-10-CM | POA: Diagnosis present

## 2023-12-07 DIAGNOSIS — R1011 Right upper quadrant pain: Secondary | ICD-10-CM | POA: Diagnosis present

## 2023-12-07 HISTORY — DX: Vitamin D deficiency, unspecified: E55.9

## 2023-12-07 HISTORY — DX: Anemia, unspecified: D64.9

## 2023-12-07 HISTORY — DX: Prediabetes: R73.03

## 2023-12-07 HISTORY — PX: CHOLECYSTECTOMY: SHX55

## 2023-12-07 LAB — GLUCOSE, CAPILLARY: Glucose-Capillary: 77 mg/dL (ref 70–99)

## 2023-12-07 LAB — URINALYSIS, ROUTINE W REFLEX MICROSCOPIC
Bilirubin Urine: NEGATIVE
Glucose, UA: NEGATIVE mg/dL
Hgb urine dipstick: NEGATIVE
Ketones, ur: 20 mg/dL — AB
Nitrite: NEGATIVE
Protein, ur: NEGATIVE mg/dL
Specific Gravity, Urine: 1.017 (ref 1.005–1.030)
pH: 6 (ref 5.0–8.0)

## 2023-12-07 LAB — BASIC METABOLIC PANEL WITH GFR
Anion gap: 15 (ref 5–15)
BUN: 7 mg/dL (ref 6–20)
CO2: 21 mmol/L — ABNORMAL LOW (ref 22–32)
Calcium: 8.7 mg/dL — ABNORMAL LOW (ref 8.9–10.3)
Chloride: 103 mmol/L (ref 98–111)
Creatinine, Ser: 0.88 mg/dL (ref 0.44–1.00)
GFR, Estimated: >60 mL/min (ref >60)
Glucose, Bld: 84 mg/dL (ref 70–99)
Potassium: 3.8 mmol/L (ref 3.5–5.1)
Sodium: 139 mmol/L (ref 135–145)

## 2023-12-07 LAB — CBC
HCT: 32.2 % — ABNORMAL LOW (ref 36.0–46.0)
Hemoglobin: 9 g/dL — ABNORMAL LOW (ref 12.0–15.0)
MCH: 20.4 pg — ABNORMAL LOW (ref 26.0–34.0)
MCHC: 28 g/dL — ABNORMAL LOW (ref 30.0–36.0)
MCV: 73 fL — ABNORMAL LOW (ref 80.0–100.0)
Platelets: 386 K/uL (ref 150–400)
RBC: 4.41 MIL/uL (ref 3.87–5.11)
RDW: 19.9 % — ABNORMAL HIGH (ref 11.5–15.5)
WBC: 6.7 K/uL (ref 4.0–10.5)
nRBC: 0 % (ref 0.0–0.2)

## 2023-12-07 LAB — POCT PREGNANCY, URINE: Preg Test, Ur: NEGATIVE

## 2023-12-07 SURGERY — LAPAROSCOPIC CHOLECYSTECTOMY
Anesthesia: General

## 2023-12-07 MED ORDER — ONDANSETRON HCL 4 MG PO TABS: 4.0000 mg | ORAL_TABLET | Freq: Every day | ORAL | 1 refills | Status: AC | PRN

## 2023-12-07 MED ORDER — FENTANYL CITRATE (PF) 100 MCG/2ML IJ SOLN
25.0000 ug | INTRAMUSCULAR | Status: DC | PRN
  Administered 2023-12-07: 25 ug via INTRAVENOUS

## 2023-12-07 MED ORDER — CHLORHEXIDINE GLUCONATE CLOTH 2 % EX PADS: 6.0000 | MEDICATED_PAD | Freq: Once | CUTANEOUS | Status: DC

## 2023-12-07 MED ORDER — LIDOCAINE HCL (CARDIAC) PF 100 MG/5ML IV SOSY
PREFILLED_SYRINGE | INTRAVENOUS | Status: DC | PRN
  Administered 2023-12-07: 80 mg via INTRATRACHEAL

## 2023-12-07 MED ORDER — ROCURONIUM BROMIDE 100 MG/10ML IV SOLN
INTRAVENOUS | Status: DC | PRN
Start: 2023-12-07 — End: 2023-12-07
  Administered 2023-12-07: 100 mg via INTRAVENOUS

## 2023-12-07 MED ORDER — MIDAZOLAM HCL 2 MG/2ML IJ SOLN
INTRAMUSCULAR | Status: AC
  Filled 2023-12-07: qty 2

## 2023-12-07 MED ORDER — LACTATED RINGERS IV SOLN: INTRAVENOUS | Status: DC

## 2023-12-07 MED ORDER — OXYCODONE HCL 5 MG/5ML PO SOLN: 5.0000 mg | Freq: Once | ORAL | Status: AC | PRN

## 2023-12-07 MED ORDER — PHENYLEPHRINE HCL (PRESSORS) 10 MG/ML IV SOLN
INTRAVENOUS | Status: DC | PRN
Start: 2023-12-07 — End: 2023-12-07
  Administered 2023-12-07: 160 ug via INTRAVENOUS

## 2023-12-07 MED ORDER — SODIUM CHLORIDE 0.9 % IR SOLN
Status: DC | PRN
  Administered 2023-12-07: 1000 mL

## 2023-12-07 MED ORDER — METHOCARBAMOL 500 MG PO TABS: 500.0000 mg | ORAL_TABLET | Freq: Three times a day (TID) | ORAL | 0 refills | Status: AC | PRN

## 2023-12-07 MED ORDER — INDOCYANINE GREEN 25 MG IV SOLR
7.5000 mg | Freq: Once | INTRAVENOUS | Status: AC
  Administered 2023-12-07: 7.5 mg via INTRAVENOUS

## 2023-12-07 MED ORDER — OXYCODONE HCL 5 MG PO TABS
ORAL_TABLET | ORAL | Status: AC
  Filled 2023-12-07: qty 1

## 2023-12-07 MED ORDER — ACETAMINOPHEN 10 MG/ML IV SOLN: 1000.0000 mg | Freq: Once | INTRAVENOUS | Status: DC | PRN

## 2023-12-07 MED ORDER — FENTANYL CITRATE (PF) 250 MCG/5ML IJ SOLN
INTRAMUSCULAR | Status: AC
  Filled 2023-12-07: qty 5

## 2023-12-07 MED ORDER — BUPIVACAINE-EPINEPHRINE (PF) 0.25% -1:200000 IJ SOLN
INTRAMUSCULAR | Status: AC
  Filled 2023-12-07: qty 30

## 2023-12-07 MED ORDER — ONDANSETRON HCL 4 MG/2ML IJ SOLN
INTRAMUSCULAR | Status: DC | PRN
Start: 2023-12-07 — End: 2023-12-07
  Administered 2023-12-07: 4 mg via INTRAVENOUS

## 2023-12-07 MED ORDER — CEFAZOLIN SODIUM-DEXTROSE 2-4 GM/100ML-% IV SOLN
2.0000 g | INTRAVENOUS | Status: AC
  Administered 2023-12-07: 2 g via INTRAVENOUS
  Filled 2023-12-07: qty 100

## 2023-12-07 MED ORDER — OXYCODONE HCL 5 MG PO TABS: 5.0000 mg | ORAL_TABLET | Freq: Four times a day (QID) | ORAL | 0 refills | Status: AC | PRN

## 2023-12-07 MED ORDER — SUGAMMADEX SODIUM 200 MG/2ML IV SOLN
INTRAVENOUS | Status: DC | PRN
Start: 2023-12-07 — End: 2023-12-07
  Administered 2023-12-07: 400 mg via INTRAVENOUS

## 2023-12-07 MED ORDER — PHENYLEPHRINE HCL-NACL 20-0.9 MG/250ML-% IV SOLN
INTRAVENOUS | Status: DC | PRN
  Administered 2023-12-07: 50 ug/min via INTRAVENOUS

## 2023-12-07 MED ORDER — IBUPROFEN 800 MG PO TABS: 800.0000 mg | ORAL_TABLET | Freq: Three times a day (TID) | ORAL | 0 refills | Status: AC | PRN

## 2023-12-07 MED ORDER — OXYCODONE HCL 5 MG PO TABS
5.0000 mg | ORAL_TABLET | Freq: Once | ORAL | Status: AC | PRN
  Administered 2023-12-07: 5 mg via ORAL

## 2023-12-07 MED ORDER — 0.9 % SODIUM CHLORIDE (POUR BTL) OPTIME
TOPICAL | Status: DC | PRN
  Administered 2023-12-07: 1000 mL

## 2023-12-07 MED ORDER — FENTANYL CITRATE (PF) 100 MCG/2ML IJ SOLN
INTRAMUSCULAR | Status: AC
  Filled 2023-12-07: qty 2

## 2023-12-07 MED ORDER — LACTATED RINGERS IV SOLN: INTRAVENOUS | Status: DC | PRN

## 2023-12-07 MED ORDER — PROPOFOL 10 MG/ML IV BOLUS
INTRAVENOUS | Status: DC | PRN
  Administered 2023-12-07: 200 mg via INTRAVENOUS

## 2023-12-07 MED ORDER — BUPIVACAINE-EPINEPHRINE 0.25% -1:200000 IJ SOLN
INTRAMUSCULAR | Status: DC | PRN
  Administered 2023-12-07: 6 mL

## 2023-12-07 MED ORDER — CHLORHEXIDINE GLUCONATE 0.12 % MT SOLN
OROMUCOSAL | Status: AC
  Administered 2023-12-07: 15 mL via OROMUCOSAL
  Filled 2023-12-07: qty 15

## 2023-12-07 MED ORDER — CHLORHEXIDINE GLUCONATE 0.12 % MT SOLN: 15.0000 mL | Freq: Once | OROMUCOSAL | Status: AC

## 2023-12-07 MED ORDER — FENTANYL CITRATE (PF) 250 MCG/5ML IJ SOLN
INTRAMUSCULAR | Status: DC | PRN
  Administered 2023-12-07: 50 ug via INTRAVENOUS
  Administered 2023-12-07: 100 ug via INTRAVENOUS

## 2023-12-07 MED ORDER — ORAL CARE MOUTH RINSE: 15.0000 mL | Freq: Once | OROMUCOSAL | Status: AC

## 2023-12-07 MED ORDER — DEXAMETHASONE SODIUM PHOSPHATE 10 MG/ML IJ SOLN
INTRAMUSCULAR | Status: DC | PRN
  Administered 2023-12-07: 10 mg via INTRAVENOUS

## 2023-12-07 SURGICAL SUPPLY — 31 items
CANISTER SUCTION 3000ML PPV (SUCTIONS) ×1 IMPLANT
CHLORAPREP W/TINT 26 (MISCELLANEOUS) ×1 IMPLANT
CLIP APPLIE ROT 10 11.4 M/L (STAPLE) ×1 IMPLANT
COVER SURGICAL LIGHT HANDLE (MISCELLANEOUS) ×1 IMPLANT
DERMABOND ADVANCED .7 DNX12 (GAUZE/BANDAGES/DRESSINGS) ×1 IMPLANT
ELECTRODE REM PT RTRN 9FT ADLT (ELECTROSURGICAL) ×1 IMPLANT
GLOVE BIO SURGEON STRL SZ8 (GLOVE) ×1 IMPLANT
GLOVE BIOGEL PI IND STRL 8 (GLOVE) ×1 IMPLANT
GOWN STRL REUS W/ TWL LRG LVL3 (GOWN DISPOSABLE) ×2 IMPLANT
GOWN STRL REUS W/ TWL XL LVL3 (GOWN DISPOSABLE) ×1 IMPLANT
IRRIGATION SUCT STRKRFLW 2 WTP (MISCELLANEOUS) ×1 IMPLANT
KIT BASIN OR (CUSTOM PROCEDURE TRAY) ×1 IMPLANT
KIT IMAGING PINPOINTPAQ (MISCELLANEOUS) IMPLANT
KIT TURNOVER KIT B (KITS) ×1 IMPLANT
NS IRRIG 1000ML POUR BTL (IV SOLUTION) ×1 IMPLANT
PAD ARMBOARD POSITIONER FOAM (MISCELLANEOUS) ×1 IMPLANT
POUCH RETRIEVAL ECOSAC 10 (ENDOMECHANICALS) ×1 IMPLANT
SCISSORS LAP 5X35 DISP (ENDOMECHANICALS) ×1 IMPLANT
SET TUBE SMOKE EVAC HIGH FLOW (TUBING) ×1 IMPLANT
SOLUTION ANTFG W/FOAM PAD STRL (MISCELLANEOUS) IMPLANT
SPECIMEN JAR SMALL (MISCELLANEOUS) ×1 IMPLANT
SUT MNCRL AB 4-0 PS2 18 (SUTURE) ×1 IMPLANT
SUT VICRYL 0 UR6 27IN ABS (SUTURE) IMPLANT
TOWEL GREEN STERILE (TOWEL DISPOSABLE) ×1 IMPLANT
TOWEL GREEN STERILE FF (TOWEL DISPOSABLE) ×1 IMPLANT
TRAY LAPAROSCOPIC MC (CUSTOM PROCEDURE TRAY) ×1 IMPLANT
TROCAR 11X100 Z THREAD (TROCAR) ×1 IMPLANT
TROCAR BALLN 12MMX100 BLUNT (TROCAR) ×1 IMPLANT
TROCAR XCEL NON-BLD 5MMX100MML (ENDOMECHANICALS) IMPLANT
WARMER LAPAROSCOPE (MISCELLANEOUS) ×1 IMPLANT
WATER STERILE IRR 1000ML POUR (IV SOLUTION) ×1 IMPLANT

## 2023-12-07 NOTE — Transfer of Care (Signed)
 Immediate Anesthesia Transfer of Care Note  Patient: Samantha Patrick  Procedure(s) Performed: LAPAROSCOPIC CHOLECYSTECTOMY with ICG DYE  Patient Location: PACU  Anesthesia Type:General  Level of Consciousness: drowsy and patient cooperative  Airway & Oxygen Therapy: Patient Spontanous Breathing and Patient connected to face mask oxygen  Post-op Assessment: Report given to RN, Post -op Vital signs reviewed and stable, Patient moving all extremities, and Patient moving all extremities X 4  Post vital signs: Reviewed and stable  Last Vitals:  Vitals Value Taken Time  BP 127/88 12/07/23 17:42  Temp 36.8 C 12/07/23 17:43  Pulse 74 12/07/23 17:46  Resp 20 12/07/23 17:46  SpO2 100 % 12/07/23 17:46  Vitals shown include unfiled device data.  Last Pain:  Vitals:   12/07/23 1743  TempSrc:   PainSc: Asleep         Complications: No notable events documented.

## 2023-12-07 NOTE — Interval H&P Note (Signed)
 History and Physical Interval Note:  12/07/2023 3:17 PM  Samantha Patrick  has presented today for surgery, with the diagnosis of BILIARY COLIC.  The various methods of treatment have been discussed with the patient and family. After consideration of risks, benefits and other options for treatment, the patient has consented to  Procedure(s) with comments: LAPAROSCOPIC CHOLECYSTECTOMY (N/A) - ICG DYE as a surgical intervention.  The patient's history has been reviewed, patient examined, no change in status, stable for surgery.  I have reviewed the patient's chart and labs.  Questions were answered to the patient's satisfaction.     Alayssa Flinchum A Alixandra Alfieri

## 2023-12-07 NOTE — Progress Notes (Signed)
 75 mcg fentanyl  wasted post patient discharge. Witnessed by Nidia Longs, RN

## 2023-12-07 NOTE — Discharge Instructions (Signed)
 [facility] ______[facility] LAPAROSCOPIC SURGERY: POST OP INSTRUCTIONS Always review your discharge instruction sheet given to you by the facility where your surgery was performed. IF YOU HAVE DISABILITY OR FAMILY LEAVE FORMS, YOU MUST BRING THEM TO THE OFFICE FOR PROCESSING.   DO NOT GIVE THEM TO YOUR DOCTOR.  A prescription for pain medication may be given to you upon discharge.  Take your pain medication as prescribed, if needed.  If narcotic pain medicine is not needed, then you may take acetaminophen  (Tylenol ) or ibuprofen  (Advil ) as needed. Take your usually prescribed medications unless otherwise directed. If you need a refill on your pain medication, please contact your pharmacy.  They will contact our office to request authorization. Prescriptions will not be filled after 5pm or on week-ends. You should follow a light diet the first few days after arrival home, such as soup and crackers, etc.  Be sure to include lots of fluids daily. Most patients will experience some swelling and bruising in the area of the incisions.  Ice packs will help.  Swelling and bruising can take several days to resolve.  It is common to experience some constipation if taking pain medication after surgery.  Increasing fluid intake and taking a stool softener (such as Colace) will usually help or prevent this problem from occurring.  A mild laxative (Milk of Magnesia or Miralax) should be taken according to package instructions if there are no bowel movements after 48 hours. Unless discharge instructions indicate otherwise, you may remove your bandages 24-48 hours after surgery, and you may shower at that time.  You may have steri-strips (small skin tapes) in place directly over the incision.  These strips should be left on the skin for 7-10 days.  If your surgeon used skin glue on the incision, you may shower in 24 hours.  The glue will flake off over the next 2-3 weeks.  Any sutures or staples will be  removed at the office during your follow-up visit. ACTIVITIES:  You may resume regular (light) daily activities beginning the next day--such as daily self-care, walking, climbing stairs--gradually increasing activities as tolerated.  You may have sexual intercourse when it is comfortable.  Refrain from any heavy lifting or straining until approved by your doctor. You may drive when you are no longer taking prescription pain medication, you can comfortably wear a seatbelt, and you can safely maneuver your car and apply brakes. RETURN TO WORK:  __________________________________________________________ Samantha Patrick should see your doctor in the office for a follow-up appointment approximately 2-3 weeks after your surgery.  Make sure that you call for this appointment within a day or two after you arrive home to insure a convenient appointment time. OTHER INSTRUCTIONS: __________________________________________________________________________________________________________________________ __________________________________________________________________________________________________________________________ WHEN TO CALL YOUR DOCTOR: Fever over 101.0 Inability to urinate Continued bleeding from incision. Increased pain, redness, or drainage from the incision. Increasing abdominal pain  The clinic staff is available to answer your questions during regular business hours.  Please don't hesitate to call and ask to speak to one of the nurses for clinical concerns.  If you have a medical emergency, go to the nearest emergency room or call 911.  A surgeon from [facility] is always on call at the hospital. [address], [address], [place], [place]  [address] ? [address] [address], [place], [place]   [address] [phone] ? [phone] ? FAX [phone] Web site: [email or web address]

## 2023-12-07 NOTE — Op Note (Signed)
 Laparoscopic Cholecystectomy with ICG  Procedure Note  Indications: This patient presents with symptomatic gallbladder disease and will undergo laparoscopic cholecystectomy.The procedure has been discussed with the patient. Operative and non operative treatments have been discussed. Risks of surgery include bleeding, infection,  Common bile duct injury,  Injury to the stomach,liver, colon,small intestine, abdominal wall,  Diaphragm,  Major blood vessels,  And the need for an open procedure.  Other risks include worsening of medical problems, death,  DVT and pulmonary embolism, and cardiovascular events.   Medical options have also been discussed. The patient has been informed of long term expectations of surgery and non surgical options,  The patient agrees to proceed.     Pre-operative Diagnosis: Calculus of gallbladder with other cholecystitis, without mention of obstruction  Post-operative Diagnosis: Same  Surgeon: Debby DELENA Shipper MD  Assistants: Powell Ronde RNFA  Anesthesia: General endotracheal anesthesia and Local anesthesia 0.25.% bupivacaine , with epinephrine   ASA Class: 2  Procedure Details  The patient was seen again in the Holding Room. The risks, benefits, complications, treatment options, and expected outcomes were discussed with the patient. The possibilities of reaction to medication, pulmonary aspiration, perforation of viscus, bleeding, recurrent infection, finding a normal gallbladder, the need for additional procedures, failure to diagnose a condition, the possible need to convert to an open procedure, and creating a complication requiring transfusion or operation were discussed with the patient. The patient and/or family concurred with the proposed plan, giving informed consent. The site of surgery properly noted/marked. The patient was taken to Operating Room, identified as [place] and the procedure verified as Laparoscopic Cholecystectomy with Intraoperative  Cholangiograms. A Time Out was held and the above information confirmed.  Prior to the induction of general anesthesia, antibiotic prophylaxis was administered. General endotracheal anesthesia was then administered and tolerated well. After the induction, the abdomen was prepped in the usual sterile fashion. The patient was positioned in the supine position with the left arm comfortably tucked, along with some reverse Trendelenburg.  Local anesthetic agent was injected into the skin near the umbilicus and an incision made. The midline fascia was incised and the Hasson technique was used to introduce a 12 mm port under direct vision. It was secured with a figure of eight Vicryl suture placed in the usual fashion. Pneumoperitoneum was then created with CO2 and tolerated well without any adverse changes in the patient's vital signs. Additional trocars were introduced under direct vision with an 11 mm trocar in the epigastrium and 2 5 mm trocars in the right upper quadrant. All skin incisions were infiltrated with a local anesthetic agent before making the incision and placing the trocars.   The gallbladder was identified, the fundus grasped and retracted cephalad. Adhesions were lysed bluntly and with the electrocautery where indicated, taking care not to injure any adjacent organs or viscus. The infundibulum was grasped and retracted laterally, exposing the peritoneum overlying the triangle of Calot. This was then divided and exposed in a blunt fashion. The cystic duct was clearly identified and bluntly dissected circumferentially. The junctions of the gallbladder, cystic duct and common bile duct were clearly identified prior to the division of any linear structure.  ICG fluorescence utilized with ICG dye.  The common bile duct junction with the cystic duct was then divided.  The critical view had been dissected around the neck of the gallbladder and the junction of the cystic duct to give a complete 360  degree view of this.  The cystic duct was extremely small  therefore cholangiogram was not performed.    The cystic duct was then  ligated with surgical clips  on the patient side and  clipped on the gallbladder side and divided. The cystic artery was identified, dissected free, ligated with clips and divided as well. Posterior cystic artery clipped and divided.  The gallbladder was dissected from the liver bed in retrograde fashion with the electrocautery. The gallbladder was removed and placed into an Endo Catch bag.. The liver bed was irrigated and inspected. Hemostasis was achieved with the electrocautery. Copious irrigation was utilized and was repeatedly aspirated until clear all particulate matter. Hemostasis was achieved with no signs  Of bleeding or bile leakage.  The gallbladder was then extracted out the umbilicus.  The pursestring suture of 0 Vicryl was closed.  An additional stitch was placed as well under direct vision.  Inspection revealed no evidence of internal visceral injury from the procedure.  There is no bile leakage or bleeding noted.  There were no other gross abnormalities with four-quadrant laparoscopy.  Pneumoperitoneum was completely reduced after viewing removal of the trocars under direct vision. The wound was thoroughly irrigated and the fascia was then closed with a figure of eight suture; the skin was then closed with 4-0 Monocryl and a sterile dressing of Dermabond was applied.  Instrument, sponge, and needle counts were correct at closure and at the conclusion of the case.   Findings:  Cholelithiasis  Estimated Blood Loss: less than 50 mL         Drains: None         Total IV Fluids: Per OR record         Specimens: Gallbladder           Complications: None; patient tolerated the procedure well.         Disposition: PACU - hemodynamically stable.         Condition: stable

## 2023-12-07 NOTE — H&P (Signed)
 Chief Complaint: Cholelithiasis  History of Present Illness: Samantha Patrick is a 33 y.o. female who is seen today as an office consultation for evaluation of Cholelithiasis  The patient presents for evaluation of gallstones. She has had 2 episodes of significant right upper quadrant pain. These have gone away with dietary changes. She is found to have gallstones and workup. The patient describes the pain as being in her upper abdomen in a belt-like distribution. It is made worse with eating fatty greasy foods. She seems to feel better if she avoids these foods.  Review of Systems: A complete review of systems was obtained from the patient. I have reviewed this information and discussed as appropriate with the patient. See HPI as well for other ROS.    Medical History: Past Medical History:  Diagnosis Date  Anemia  Diabetes mellitus without complication (CMS/HHS-HCC)   There is no problem list on file for this patient.  History reviewed. No pertinent surgical history.   No Known Allergies  Current Outpatient Medications on File Prior to Visit  Medication Sig Dispense Refill  cholecalciferol (VITAMIN D3) 1,250 mcg (50,000 unit) capsule TAKE 1 CAPSULE (ORAL) EVERY WEEK FOR 8 WEEKS  ferrous sulfate 325 (65 FE) MG tablet TAKE 1 TABLET (ORAL) 4 TIMES PER WEEK FOR 90 DAYS  VENTOLIN HFA 90 mcg/actuation inhaler Inhale 2 inhalations into the lungs every 4 (four) hours as needed  aspirin 81 MG EC tablet Take 81 mg by mouth  celecoxib  (CELEBREX ) 200 MG capsule Take 200 mg by mouth 2 (two) times daily  naproxen  (NAPROSYN ) 500 MG tablet Take 500 mg by mouth 2 (two) times daily  ondansetron  (ZOFRAN -ODT) 4 MG disintegrating tablet DISSOLVE 1 TABLET IN THE MOUTH EVERY SIX HOURS AS NEEDED FOR NAUSEA FOR UP TO 7 DAYS.   No current facility-administered medications on file prior to visit.   Family History  Problem Relation Age of Onset  Skin cancer Mother  High blood pressure (Hypertension)  Mother  Hyperlipidemia (Elevated cholesterol) Mother  Coronary Artery Disease (Blocked arteries around heart) Mother  Diabetes Mother  Coronary Artery Disease (Blocked arteries around heart) Maternal Grandmother  Heart disease Maternal Grandmother    Social History   Tobacco Use  Smoking Status Never  Smokeless Tobacco Never    Social History   Socioeconomic History  Marital status: Single  Tobacco Use  Smoking status: Never  Smokeless tobacco: Never  Substance and Sexual Activity  Drug use: Never   Social Drivers of Health   Received from [employer]  Social Network   Objective:   Vitals:  11/14/23 1131  BP: 132/84  Pulse: 100  Temp: 36.8 C (98.2 F)  SpO2: 100%  Weight: (!) 118 kg (260 lb 3.2 oz)  Height: 160 cm (5' 3)  PainSc: 0-No pain   Body mass index is 46.09 kg/m.  Physical Exam Exam conducted with a chaperone present.  Cardiovascular:  Rate and Rhythm: Normal rate.  Pulmonary:  Effort: Pulmonary effort is normal.  Abdominal:  General: Abdomen is flat. There is no distension.  Palpations: Abdomen is soft.  Tenderness: There is no abdominal tenderness.  Skin: Comments: Multiple chronic skin lesions consistent with abscess/acne.  Neurological:  Mental Status: She is alert.     Labs, Imaging and Diagnostic Testing:  CLINICAL DATA: 151471 RUQ pain 151471  EXAM: ULTRASOUND ABDOMEN LIMITED RIGHT UPPER QUADRANT  COMPARISON: None Available.  FINDINGS: Gallbladder:  Multiple small gallstones. No wall thickening or pericholecystic fluid. Positive sonographic Murphy's sign noted by sonographer.  Common bile duct:  Diameter: 3 mm  Liver:  Increased echogenicity. No focal lesion identified. No intrahepatic biliary ductal dilation. Portal vein is patent on color Doppler imaging with normal direction of blood flow towards the liver.  Right Kidney:  Partially visualized. No mass. No hydronephrosis or nephrolithiasis.  Other:  None.  IMPRESSION: Multiple small gallstones with a positive sonographic Murphy sign. In the absence of wall thickening and pericholecystic fluid, the study is equivocal for acute cholecystitis. These changes could be related to upper abdominal inflammation, acute hepatitis, hypoproteinemia, or volume overload. Laboratory correlation recommended. If concern persists for acute cholecystitis, a nuclear medicine hepatobiliary scan could be considered.   Electronically Signed By: Rogelia Myers M.D. On: 10/28/2023 20:44 Assessment and Plan:   Diagnoses and all orders for this visit:  Biliary colic   Discussed laparoscopic cholecystectomy with the patient. She does have symptomatic cholelithiasis and she will think over surgery but plans on getting her gallbladder removed at some point. Plan will be to schedule surgery and she can decide on timing. Risks and benefits of gallbladder surgery reviewed as well as rationale natural pathophysiology of the disease process.  The procedure has been discussed with the patient. Operative and non operative treatments have been discussed. Risks of surgery include bleeding, infection, Common bile duct injury, Injury to the stomach,liver, colon,small intestine, abdominal wall, Diaphragm, Major blood vessels, And the need for an open procedure. Other risks include worsening of medical problems, death, DVT and pulmonary embolism, and cardiovascular events. Medical options have also been discussed. The patient has been informed of long term expectations of surgery and non surgical options, The patient agrees to proceed.    DEBBY CURTISTINE SHIPPER, MD

## 2023-12-07 NOTE — Anesthesia Procedure Notes (Signed)
 Procedure Name: Intubation Date/Time: 12/07/2023 4:19 PM  Performed by: Arvell Edsel HERO, CRNAPre-anesthesia Checklist: Patient identified, Emergency Drugs available, Suction available, Patient being monitored and Timeout performed Patient Re-evaluated:Patient Re-evaluated prior to induction Oxygen Delivery Method: Circle system utilized Preoxygenation: Pre-oxygenation with 100% oxygen Induction Type: IV induction Ventilation: Oral airway inserted - appropriate to patient size and Two handed mask ventilation required Laryngoscope Size: Glidescope and 3 Grade View: Grade I Tube size: 7.0 mm Number of attempts: 1 Airway Equipment and Method: Patient positioned with wedge pillow and Video-laryngoscopy Placement Confirmation: ETT inserted through vocal cords under direct vision, positive ETCO2, CO2 detector and breath sounds checked- equal and bilateral Secured at: 24 cm Tube secured with: Tape

## 2023-12-07 NOTE — Anesthesia Preprocedure Evaluation (Signed)
 Anesthesia Evaluation  Patient identified by MRN, date of birth, ID band Patient awake    Reviewed: Allergy & Precautions, NPO status , Patient's Chart, lab work & pertinent test results  History of Anesthesia Complications Negative for: history of anesthetic complications  Airway Mallampati: III  TM Distance: >3 FB Neck ROM: Full    Dental  (+) Chipped, Missing, Dental Advisory Given, Loose,    Pulmonary neg shortness of breath, sleep apnea , neg COPD   breath sounds clear to auscultation       Cardiovascular negative cardio ROS  Rhythm:Regular     Neuro/Psych  Headaches    GI/Hepatic Neg liver ROS,,, BILIARY COLIC   Endo/Other    Class 3 obesity  Renal/GU Lab Results      Component                Value               Date                      NA                       139                 12/07/2023                K                        3.8                 12/07/2023                CO2                      21 (L)              12/07/2023                GLUCOSE                  84                  12/07/2023                BUN                      7                   12/07/2023                CREATININE               0.88                12/07/2023                CALCIUM                  8.7 (L)             12/07/2023                GFRNONAA                 >60                 12/07/2023  Musculoskeletal negative musculoskeletal ROS (+)    Abdominal   Peds  Hematology  (+) Blood dyscrasia, anemia Lab Results      Component                Value               Date                      WBC                      6.7                 12/07/2023                HGB                      9.0 (L)             12/07/2023                HCT                      32.2 (L)            12/07/2023                MCV                      73.0 (L)            12/07/2023                PLT                      386                  12/07/2023              Anesthesia Other Findings   Reproductive/Obstetrics                              Anesthesia Physical Anesthesia Plan  ASA: 3  Anesthesia Plan: General   Post-op Pain Management: Ofirmev  IV (intra-op)* and Toradol  IV (intra-op)*   Induction: Intravenous  PONV Risk Score and Plan: 4 or greater and Ondansetron , Dexamethasone  and Midazolam   Airway Management Planned: Oral ETT  Additional Equipment: None  Intra-op Plan:   Post-operative Plan: Extubation in OR  Informed Consent: I have reviewed the patients History and Physical, chart, labs and discussed the procedure including the risks, benefits and alternatives for the proposed anesthesia with the patient or authorized representative who has indicated his/her understanding and acceptance.     Dental advisory given  Plan Discussed with: CRNA  Anesthesia Plan Comments:         Anesthesia Quick Evaluation

## 2023-12-08 ENCOUNTER — Encounter (HOSPITAL_COMMUNITY): Payer: Self-pay | Admitting: Surgery

## 2023-12-08 NOTE — Discharge Summary (Signed)
 Physician Discharge Summary  Patient ID: Samantha Patrick MRN: [id number] DOB/AGE: [birth year] 33 y.o.  Admit date: 12/07/2023 Discharge date: 12/08/2023  Admission Diagnoses:gallstones   Discharge Diagnoses:  Principal Problem:   Gallstones   Pt d/c from PACU    PT WAS NOT ADMITTED   Treatments: surgery: LAP CHOLE   Discharge Exam: Blood pressure 139/89, pulse 76, temperature 97.8 F (36.6 C), resp. rate 19, height 5' 3 (1.6 m), weight 116.9 kg, last menstrual period 11/21/2023, SpO2 95%, unknown if currently breastfeeding. Incision/Wound:cdi PORT SITES   Disposition: Discharge disposition: 01-Home or Self Care       Discharge Instructions     Diet - low sodium heart healthy   Complete by: As directed    Increase activity slowly   Complete by: As directed       Allergies as of 12/07/2023   No Known Allergies      Medication List     TAKE these medications    celecoxib  200 MG capsule Commonly known as: CeleBREX  Take 1 capsule (200 mg total) by mouth 2 (two) times daily. What changed:  when to take this reasons to take this   Cholecalciferol 1.25 MG (50000 UT) capsule Take 50,000 Units by mouth daily.   ciprofloxacin 500 MG tablet Commonly known as: CIPRO Take 500 mg by mouth 2 (two) times daily.   ferrous sulfate 325 (65 FE) MG tablet Take 325 mg by mouth daily with breakfast.   ibuprofen  800 MG tablet Commonly known as: ADVIL  Take 1 tablet (800 mg total) by mouth every 8 (eight) hours as needed.   methocarbamol  500 MG tablet Commonly known as: ROBAXIN  Take 1 tablet (500 mg total) by mouth 3 (three) times daily as needed for muscle spasms.   ondansetron  4 MG tablet Commonly known as: ZOFRAN  Take 1 tablet (4 mg total) by mouth every 6 (six) hours. What changed: Another medication with the same name was added. Make sure you understand how and when to take each.   ondansetron  4 MG tablet Commonly known as: Zofran  Take 1 tablet (4 mg total)  by mouth daily as needed for nausea or vomiting. What changed: You were already taking a medication with the same name, and this prescription was added. Make sure you understand how and when to take each.   oxyCODONE  5 MG immediate release tablet Commonly known as: Oxy IR/ROXICODONE  Take 1 tablet (5 mg total) by mouth every 6 (six) hours as needed for severe pain (pain score 7-10).         Signed: Debby LABOR Winferd Wease 12/08/2023, 7:25 AM

## 2023-12-08 NOTE — Anesthesia Postprocedure Evaluation (Signed)
 Anesthesia Post Note  Patient: Samantha Patrick  Procedure(s) Performed: LAPAROSCOPIC CHOLECYSTECTOMY with ICG DYE     Patient location during evaluation: PACU Anesthesia Type: General Level of consciousness: awake and alert Pain management: pain level controlled Vital Signs Assessment: post-procedure vital signs reviewed and stable Respiratory status: spontaneous breathing, nonlabored ventilation and respiratory function stable Cardiovascular status: blood pressure returned to baseline and stable Postop Assessment: no apparent nausea or vomiting Anesthetic complications: no   No notable events documented.                  Virgal Warmuth

## 2023-12-09 LAB — SURGICAL PATHOLOGY
